# Patient Record
Sex: Female | Born: 1970 | Race: White | Hispanic: No | Marital: Single | Smoking: Current every day smoker
Health system: Southern US, Community
[De-identification: ages and names within clinical notes are randomized; demographics above are authoritative.]

## PROBLEM LIST (undated history)

## (undated) DIAGNOSIS — M5030 Other cervical disc degeneration, unspecified cervical region: Secondary | ICD-10-CM

## (undated) DIAGNOSIS — F419 Anxiety disorder, unspecified: Secondary | ICD-10-CM

## (undated) DIAGNOSIS — F319 Bipolar disorder, unspecified: Secondary | ICD-10-CM

## (undated) DIAGNOSIS — R569 Unspecified convulsions: Secondary | ICD-10-CM

## (undated) DIAGNOSIS — G8929 Other chronic pain: Secondary | ICD-10-CM

## (undated) DIAGNOSIS — F329 Major depressive disorder, single episode, unspecified: Secondary | ICD-10-CM

## (undated) DIAGNOSIS — S0990XA Unspecified injury of head, initial encounter: Secondary | ICD-10-CM

## (undated) DIAGNOSIS — F32A Depression, unspecified: Secondary | ICD-10-CM

## (undated) HISTORY — PX: TUBAL LIGATION: SHX77

## (undated) HISTORY — PX: OTHER SURGICAL HISTORY: SHX169

---

## 2003-02-20 ENCOUNTER — Emergency Department (HOSPITAL_COMMUNITY): Admission: EM | Admit: 2003-02-20 | Discharge: 2003-02-20 | Payer: Self-pay | Admitting: Emergency Medicine

## 2005-02-05 ENCOUNTER — Emergency Department (HOSPITAL_COMMUNITY): Admission: EM | Admit: 2005-02-05 | Discharge: 2005-02-05 | Payer: Self-pay | Admitting: Emergency Medicine

## 2009-10-23 ENCOUNTER — Emergency Department (HOSPITAL_COMMUNITY): Admission: EM | Admit: 2009-10-23 | Discharge: 2009-10-23 | Payer: Self-pay | Admitting: Emergency Medicine

## 2010-07-06 ENCOUNTER — Emergency Department (HOSPITAL_COMMUNITY): Admission: EM | Admit: 2010-07-06 | Discharge: 2010-01-07 | Payer: Self-pay | Admitting: Emergency Medicine

## 2010-09-24 ENCOUNTER — Emergency Department (HOSPITAL_COMMUNITY)
Admission: EM | Admit: 2010-09-24 | Discharge: 2010-09-24 | Disposition: A | Discharge status: Home / Self Care | Payer: Medicaid Other | Attending: Emergency Medicine | Admitting: Emergency Medicine

## 2010-09-24 DIAGNOSIS — R109 Unspecified abdominal pain: Secondary | ICD-10-CM | POA: Insufficient documentation

## 2010-09-24 DIAGNOSIS — R319 Hematuria, unspecified: Secondary | ICD-10-CM | POA: Insufficient documentation

## 2010-09-24 LAB — BASIC METABOLIC PANEL
CO2: 27 mEq/L (ref 19–32)
GFR calc Af Amer: >60 mL/min (ref >60)
GFR calc non Af Amer: 50 mL/min — ABNORMAL LOW (ref >60)
Potassium: 3.5 mEq/L (ref 3.5–5.1)
Sodium: 134 mEq/L — ABNORMAL LOW (ref 135–145)

## 2010-09-24 LAB — URINALYSIS, ROUTINE W REFLEX MICROSCOPIC
Bilirubin Urine: NEGATIVE
Leukocytes, UA: NEGATIVE
Specific Gravity, Urine: 1.02 (ref 1.005–1.030)
Urine Glucose, Fasting: NEGATIVE mg/dL
pH: 6 (ref 5.0–8.0)

## 2010-09-24 LAB — DIFFERENTIAL
Eosinophils Relative: 0 % (ref 0–5)
Monocytes Absolute: 0.5 10*3/uL (ref 0.1–1.0)
Neutro Abs: 6.8 10*3/uL (ref 1.7–7.7)

## 2010-09-24 LAB — CBC
HCT: 47.4 % — ABNORMAL HIGH (ref 36.0–46.0)
Hemoglobin: 16 g/dL — ABNORMAL HIGH (ref 12.0–15.0)
MCH: 31.5 pg (ref 26.0–34.0)
RBC: 5.08 MIL/uL (ref 3.87–5.11)
WBC: 8.5 10*3/uL (ref 4.0–10.5)

## 2010-09-24 LAB — URINE MICROSCOPIC-ADD ON

## 2010-09-26 LAB — URINE CULTURE: Culture  Setup Time: 201202271801

## 2010-10-16 LAB — URINALYSIS, ROUTINE W REFLEX MICROSCOPIC
Hgb urine dipstick: NEGATIVE
Ketones, ur: NEGATIVE mg/dL
Protein, ur: NEGATIVE mg/dL
Specific Gravity, Urine: 1.025 (ref 1.005–1.030)

## 2010-12-15 ENCOUNTER — Emergency Department (HOSPITAL_COMMUNITY): Payer: Medicaid Other

## 2010-12-15 ENCOUNTER — Emergency Department (HOSPITAL_COMMUNITY)
Admission: EM | Admit: 2010-12-15 | Discharge: 2010-12-15 | Disposition: A | Discharge status: Home / Self Care | Payer: Medicaid Other | Attending: Emergency Medicine | Admitting: Emergency Medicine

## 2010-12-15 DIAGNOSIS — S60229A Contusion of unspecified hand, initial encounter: Secondary | ICD-10-CM | POA: Insufficient documentation

## 2010-12-15 DIAGNOSIS — S139XXA Sprain of joints and ligaments of unspecified parts of neck, initial encounter: Secondary | ICD-10-CM | POA: Insufficient documentation

## 2010-12-15 DIAGNOSIS — S6000XA Contusion of unspecified finger without damage to nail, initial encounter: Secondary | ICD-10-CM | POA: Insufficient documentation

## 2011-01-04 ENCOUNTER — Emergency Department (HOSPITAL_COMMUNITY): Payer: Medicaid Other

## 2011-01-04 ENCOUNTER — Emergency Department (HOSPITAL_COMMUNITY)
Admission: EM | Admit: 2011-01-04 | Discharge: 2011-01-04 | Disposition: A | Discharge status: Home / Self Care | Payer: Medicaid Other | Attending: Emergency Medicine | Admitting: Emergency Medicine

## 2011-01-04 DIAGNOSIS — F319 Bipolar disorder, unspecified: Secondary | ICD-10-CM | POA: Insufficient documentation

## 2011-01-04 DIAGNOSIS — F17200 Nicotine dependence, unspecified, uncomplicated: Secondary | ICD-10-CM | POA: Insufficient documentation

## 2011-01-04 DIAGNOSIS — M25569 Pain in unspecified knee: Secondary | ICD-10-CM | POA: Insufficient documentation

## 2011-01-04 DIAGNOSIS — S0003XA Contusion of scalp, initial encounter: Secondary | ICD-10-CM | POA: Insufficient documentation

## 2011-09-29 ENCOUNTER — Encounter (HOSPITAL_COMMUNITY): Payer: Self-pay

## 2011-09-29 ENCOUNTER — Emergency Department (HOSPITAL_COMMUNITY): Payer: Medicaid Other

## 2011-09-29 ENCOUNTER — Emergency Department (HOSPITAL_COMMUNITY)
Admission: EM | Admit: 2011-09-29 | Discharge: 2011-09-29 | Disposition: A | Discharge status: Home / Self Care | Payer: Medicaid Other | Attending: Emergency Medicine | Admitting: Emergency Medicine

## 2011-09-29 DIAGNOSIS — S20219A Contusion of unspecified front wall of thorax, initial encounter: Secondary | ICD-10-CM | POA: Insufficient documentation

## 2011-09-29 DIAGNOSIS — S0990XA Unspecified injury of head, initial encounter: Secondary | ICD-10-CM | POA: Insufficient documentation

## 2011-09-29 DIAGNOSIS — S60222A Contusion of left hand, initial encounter: Secondary | ICD-10-CM

## 2011-09-29 DIAGNOSIS — F319 Bipolar disorder, unspecified: Secondary | ICD-10-CM | POA: Insufficient documentation

## 2011-09-29 DIAGNOSIS — R079 Chest pain, unspecified: Secondary | ICD-10-CM | POA: Insufficient documentation

## 2011-09-29 DIAGNOSIS — M7989 Other specified soft tissue disorders: Secondary | ICD-10-CM | POA: Insufficient documentation

## 2011-09-29 DIAGNOSIS — M79609 Pain in unspecified limb: Secondary | ICD-10-CM | POA: Insufficient documentation

## 2011-09-29 DIAGNOSIS — R51 Headache: Secondary | ICD-10-CM | POA: Insufficient documentation

## 2011-09-29 DIAGNOSIS — S60229A Contusion of unspecified hand, initial encounter: Secondary | ICD-10-CM | POA: Insufficient documentation

## 2011-09-29 DIAGNOSIS — H7290 Unspecified perforation of tympanic membrane, unspecified ear: Secondary | ICD-10-CM | POA: Insufficient documentation

## 2011-09-29 HISTORY — DX: Bipolar disorder, unspecified: F31.9

## 2011-09-29 MED ORDER — HYDROCODONE-ACETAMINOPHEN 5-325 MG PO TABS: ORAL_TABLET | ORAL | Status: DC

## 2011-09-29 MED ORDER — HYDROCODONE-ACETAMINOPHEN 5-325 MG PO TABS
1.0000 | ORAL_TABLET | Freq: Once | ORAL | Status: AC
  Administered 2011-09-29: 1 via ORAL
  Filled 2011-09-29: qty 1

## 2011-09-29 NOTE — Discharge Instructions (Signed)
Blunt Chest Trauma Chest injury caused by blunt trauma can be very painful. The pain is worse with movement or breathing. Most often these injuries result in bruised or fractured ribs. Minor rib fractures may not be seen on the initial X-rays. Most broken and bruised ribs from blunt chest injuries are not serious and get better within 1 to 3 weeks with rest and mild pain medicine. Internal organs (heart, lungs) can be injured with blunt trauma. Further examination may be needed. Limit activities until you can move around without much pain. Do not do any strenuous work until your injury is healed. Ice packs may be put on the injured area for 20 to 30 minutes several times daily to reduce pain. Rib belts may also be used to reduce pain and should be worn as directed by your caregiver. Take several deep breaths hourly to keep your lungs clear.  SEEK IMMEDIATE MEDICAL CARE IF:   You develop increased pain, shortness of breath, or cough up blood.   You develop nausea, vomiting, or abdominal pain.   You develop fever, dizziness, weakness, or fainting.  Document Released: 08/23/2004 Document Revised: 03/28/2011 Document Reviewed: 07/16/2005 [facility] Patient Information 2012 [place], [place].Head Injury, Adult You have had a head injury that does not appear serious at this time. A concussion is a state of changed mental ability, usually from a blow to the head. You should take clear liquids for the rest of the day and then resume your regular diet. You should not take sedatives or alcoholic beverages for as long as directed by your caregiver after discharge. After injuries such as yours, most problems occur within the first 24 hours. SYMPTOMS These minor symptoms may be experienced after discharge:  Memory difficulties.   Dizziness.   Headaches.   Double vision.   Hearing difficulties.   Depression.   Tiredness.   Weakness.   Difficulty with concentration.  If you experience any of these  problems, you should not be alarmed. A concussion requires a few days for recovery. Many patients with head injuries frequently experience such symptoms. Usually, these problems disappear without medical care. If symptoms last for more than one day, notify your caregiver. See your caregiver sooner if symptoms are becoming worse rather than better. HOME CARE INSTRUCTIONS   During the next 24 hours you must stay with someone who can watch you for the warning signs listed below.  Although it is unlikely that serious side effects will occur, you should be aware of signs and symptoms which may necessitate your return to this location. Side effects may occur up to 7 - 10 days following the injury. It is important for you to carefully monitor your condition and contact your caregiver or seek immediate medical attention if there is a change in your condition. SEEK IMMEDIATE MEDICAL CARE IF:   There is confusion or drowsiness.   You can not awaken the injured person.   There is nausea (feeling sick to your stomach) or continued, forceful vomiting.   You notice dizziness or unsteadiness which is getting worse, or inability to walk.   You have convulsions or unconsciousness.   You experience severe, persistent headaches not relieved by over-the-counter or prescription medicines for pain. (Do not take aspirin as this impairs clotting abilities). Take other pain medications only as directed.   You can not use arms or legs normally.   There is clear or bloody discharge from the nose or ears.  MAKE SURE YOU:   Understand these instructions.  Will watch your condition.   Will get help right away if you are not doing well or get worse.  Document Released: 07/16/2005 Document Revised: 03/28/2011 Document Reviewed: 06/03/2009 [facility] Patient Information 2012 [place], [place].Cryotherapy Cryotherapy means treatment with cold. Ice or gel packs can be used to reduce both pain and swelling. Ice is the most  helpful within the first 24 to 48 hours after an injury or flareup from overusing a muscle or joint. Sprains, strains, spasms, burning pain, shooting pain, and aches can all be eased with ice. Ice can also be used when recovering from surgery. Ice is effective, has very few side effects, and is safe for most people to use. PRECAUTIONS  Ice is not a safe treatment option for people with:  Raynaud's phenomenon. This is a condition affecting small blood vessels in the extremities. Exposure to cold may cause your problems to return.   Cold hypersensitivity. There are many forms of cold hypersensitivity, including:   Cold urticaria. Red, itchy hives appear on the skin when the tissues begin to warm after being iced.   Cold erythema. This is a red, itchy rash caused by exposure to cold.   Cold hemoglobinuria. Red blood cells break down when the tissues begin to warm after being iced. The hemoglobin that carry oxygen are passed into the urine because they cannot combine with blood proteins fast enough.   Numbness or altered sensitivity in the area being iced.  If you have any of the following conditions, do not use ice until you have discussed cryotherapy with your caregiver:  Heart conditions, such as arrhythmia, angina, or chronic heart disease.   High blood pressure.   Healing wounds or open skin in the area being iced.   Current infections.   Rheumatoid arthritis.   Poor circulation.   Diabetes.  Ice slows the blood flow in the region it is applied. This is beneficial when trying to stop inflamed tissues from spreading irritating chemicals to surrounding tissues. However, if you expose your skin to cold temperatures for too long or without the proper protection, you can damage your skin or nerves. Watch for signs of skin damage due to cold. HOME CARE INSTRUCTIONS Follow these tips to use ice and cold packs safely.  Place a dry or damp towel between the ice and skin. A damp towel will  cool the skin more quickly, so you may need to shorten the time that the ice is used.   For a more rapid response, add gentle compression to the ice.   Ice for no more than 10 to 20 minutes at a time. The bonier the area you are icing, the less time it will take to get the benefits of ice.   Check your skin after 5 minutes to make sure there are no signs of a poor response to cold or skin damage.   Rest 20 minutes or more in between uses.   Once your skin is numb, you can end your treatment. You can test numbness by very lightly touching your skin. The touch should be so light that you do not see the skin dimple from the pressure of your fingertip. When using ice, most people will feel these normal sensations in this order: cold, burning, aching, and numbness.   Do not use ice on someone who cannot communicate their responses to pain, such as small children or people with dementia.  HOW TO MAKE AN ICE PACK Ice packs are the most common way to use  ice therapy. Other methods include ice massage, ice baths, and cryo-sprays. Muscle creams that cause a cold, tingly feeling do not offer the same benefits that ice offers and should not be used as a substitute unless recommended by your caregiver. To make an ice pack, do one of the following:  Place crushed ice or a bag of frozen vegetables in a sealable plastic bag. Squeeze out the excess air. Place this bag inside another plastic bag. Slide the bag into a pillowcase or place a damp towel between your skin and the bag.   Mix 3 parts water with 1 part rubbing alcohol. Freeze the mixture in a sealable plastic bag. When you remove the mixture from the freezer, it will be slushy. Squeeze out the excess air. Place this bag inside another plastic bag. Slide the bag into a pillowcase or place a damp towel between your skin and the bag.  SEEK MEDICAL CARE IF:  You develop white spots on your skin. This may give the skin a blotchy (mottled) appearance.    Your skin turns blue or pale.   Your skin becomes waxy or hard.   Your swelling gets worse.  MAKE SURE YOU:   Understand these instructions.   Will watch your condition.   Will get help right away if you are not doing well or get worse.  Document Released: 03/12/2011 Document Reviewed: 03/08/2011 [facility] Patient Information 2012 [place], [place].   Take the pain medicine as directed.  Apply ice several times daily to areas of soreness or swelling.  Return to the ED if you note any change in level of consciousness, behavior or coordination.
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
## 2011-09-29 NOTE — ED Notes (Signed)
Pt refusing for RN to provide care per PA. Student nurse at bedside for buddy tape of left index and middle finger and splint placement.
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
## 2011-09-29 NOTE — ED Notes (Signed)
Pt out to nurses station and states "ive been here for 3 1/2 hours. I will go to [facility]". Pt instructed she was the next pt and to return to her room. Pt standing at nurses station yelling. Security notified of pt behavior.

## 2011-09-29 NOTE — ED Notes (Signed)
When pt brought back from x-ray radiotech notified RN of pt behavior in radiology dept. Per radiology tech pt was belligerent, confrontational, and non compliant during radiology exam. Once pt came back while in the hallway pt stated "I need to see one of you right now".

## 2011-09-29 NOTE — ED Notes (Signed)
Pt a/ox4. Resp even and unlabored. NAD at this time. D/C instructions reviewed with pt. Pt verbalized understanding. Pt ambulated to lobby with steady gate.  

## 2011-09-29 NOTE — ED Notes (Signed)
Pt being rude to RN. Pt states "Tiffany Blankenship been here 3 hours. I probably have an aneurism in my head". Pt instructed that behavior will not be tolerated and she will be seen by PA next. Family aplagetic to RN.

## 2011-10-06 NOTE — ED Provider Notes (Signed)
Medical screening examination/treatment/procedure(s) were performed by non-physician practitioner and as supervising physician I was immediately available for consultation/collaboration.  Joseline Mccampbell S. Tiearra Colwell, MD 10/06/11 1814 

## 2012-07-07 ENCOUNTER — Encounter (HOSPITAL_COMMUNITY): Payer: Self-pay | Admitting: *Deleted

## 2012-07-07 ENCOUNTER — Emergency Department (HOSPITAL_COMMUNITY)
Admission: EM | Admit: 2012-07-07 | Discharge: 2012-07-07 | Disposition: A | Discharge status: Home / Self Care | Payer: Medicaid Other | Attending: Emergency Medicine | Admitting: Emergency Medicine

## 2012-07-07 ENCOUNTER — Emergency Department (HOSPITAL_COMMUNITY): Payer: Medicaid Other

## 2012-07-07 DIAGNOSIS — Y9389 Activity, other specified: Secondary | ICD-10-CM | POA: Insufficient documentation

## 2012-07-07 DIAGNOSIS — Z79899 Other long term (current) drug therapy: Secondary | ICD-10-CM | POA: Insufficient documentation

## 2012-07-07 DIAGNOSIS — S022XXA Fracture of nasal bones, initial encounter for closed fracture: Secondary | ICD-10-CM

## 2012-07-07 DIAGNOSIS — F319 Bipolar disorder, unspecified: Secondary | ICD-10-CM | POA: Insufficient documentation

## 2012-07-07 MED ORDER — CEPHALEXIN 500 MG PO CAPS: 500.0000 mg | ORAL_CAPSULE | Freq: Four times a day (QID) | ORAL | Status: DC

## 2012-07-07 MED ORDER — HYDROCODONE-ACETAMINOPHEN 5-325 MG PO TABS
1.0000 | ORAL_TABLET | Freq: Once | ORAL | Status: AC
  Administered 2012-07-07: 1 via ORAL
  Filled 2012-07-07: qty 1

## 2012-07-07 MED ORDER — HYDROCODONE-ACETAMINOPHEN 5-325 MG PO TABS: ORAL_TABLET | ORAL | Status: DC
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
## 2012-07-11 NOTE — ED Provider Notes (Signed)
Medical screening examination/treatment/procedure(s) were conducted as a shared visit with non-physician practitioner(s) and myself.  I personally evaluated the patient during the encounter.  No neuro def.  CT shows bilat nasal bone fx  Donnetta Hutching, MD 07/11/12 [id number]

## 2012-10-13 NOTE — ED Notes (Signed)
rx tech from [facility] in [place] called pt attempting to fill script from dec 9th, 2013, wanted information and requested if should fill, dr.cook notified. Told not to fill script at this time. [facility] rx tech notified.

## 2012-12-17 ENCOUNTER — Emergency Department (HOSPITAL_COMMUNITY): Payer: Medicaid Other

## 2012-12-17 ENCOUNTER — Encounter (HOSPITAL_COMMUNITY): Payer: Self-pay

## 2012-12-17 ENCOUNTER — Emergency Department (HOSPITAL_COMMUNITY)
Admission: EM | Admit: 2012-12-17 | Discharge: 2012-12-17 | Disposition: A | Discharge status: Home / Self Care | Payer: Medicaid Other | Attending: Emergency Medicine | Admitting: Emergency Medicine

## 2012-12-17 DIAGNOSIS — F17200 Nicotine dependence, unspecified, uncomplicated: Secondary | ICD-10-CM | POA: Insufficient documentation

## 2012-12-17 DIAGNOSIS — R110 Nausea: Secondary | ICD-10-CM | POA: Insufficient documentation

## 2012-12-17 DIAGNOSIS — S060X9A Concussion with loss of consciousness of unspecified duration, initial encounter: Secondary | ICD-10-CM | POA: Insufficient documentation

## 2012-12-17 DIAGNOSIS — Y9289 Other specified places as the place of occurrence of the external cause: Secondary | ICD-10-CM | POA: Insufficient documentation

## 2012-12-17 DIAGNOSIS — S025XXA Fracture of tooth (traumatic), initial encounter for closed fracture: Secondary | ICD-10-CM | POA: Insufficient documentation

## 2012-12-17 DIAGNOSIS — S0993XA Unspecified injury of face, initial encounter: Secondary | ICD-10-CM | POA: Insufficient documentation

## 2012-12-17 DIAGNOSIS — Z880 Allergy status to penicillin: Secondary | ICD-10-CM | POA: Insufficient documentation

## 2012-12-17 DIAGNOSIS — F319 Bipolar disorder, unspecified: Secondary | ICD-10-CM | POA: Insufficient documentation

## 2012-12-17 DIAGNOSIS — S9030XA Contusion of unspecified foot, initial encounter: Secondary | ICD-10-CM | POA: Insufficient documentation

## 2012-12-17 DIAGNOSIS — Z79899 Other long term (current) drug therapy: Secondary | ICD-10-CM | POA: Insufficient documentation

## 2012-12-17 DIAGNOSIS — W1809XA Striking against other object with subsequent fall, initial encounter: Secondary | ICD-10-CM | POA: Insufficient documentation

## 2012-12-17 DIAGNOSIS — S069X9A Unspecified intracranial injury with loss of consciousness of unspecified duration, initial encounter: Secondary | ICD-10-CM

## 2012-12-17 DIAGNOSIS — Y9301 Activity, walking, marching and hiking: Secondary | ICD-10-CM | POA: Insufficient documentation

## 2012-12-17 DIAGNOSIS — Z3202 Encounter for pregnancy test, result negative: Secondary | ICD-10-CM | POA: Insufficient documentation

## 2012-12-17 DIAGNOSIS — S298XXA Other specified injuries of thorax, initial encounter: Secondary | ICD-10-CM | POA: Insufficient documentation

## 2012-12-17 DIAGNOSIS — T07XXXA Unspecified multiple injuries, initial encounter: Secondary | ICD-10-CM

## 2012-12-17 HISTORY — DX: Unspecified injury of head, initial encounter: S09.90XA

## 2012-12-17 MED ORDER — MELOXICAM 7.5 MG PO TABS
7.5000 mg | ORAL_TABLET | Freq: Once | ORAL | Status: AC
  Administered 2012-12-17: 7.5 mg via ORAL
  Filled 2012-12-17: qty 1

## 2012-12-17 MED ORDER — ONDANSETRON HCL 4 MG PO TABS: 4.0000 mg | ORAL_TABLET | Freq: Three times a day (TID) | ORAL | Status: DC | PRN

## 2012-12-17 MED ORDER — MELOXICAM 7.5 MG PO TABS: 7.5000 mg | ORAL_TABLET | Freq: Every day | ORAL | Status: DC

## 2012-12-17 MED ORDER — IBUPROFEN 600 MG PO TABS: 600.0000 mg | ORAL_TABLET | Freq: Four times a day (QID) | ORAL | Status: DC | PRN

## 2012-12-17 MED ORDER — HYDROCODONE-ACETAMINOPHEN 5-325 MG PO TABS
1.0000 | ORAL_TABLET | Freq: Once | ORAL | Status: DC
  Filled 2012-12-17: qty 1

## 2012-12-17 MED ORDER — ONDANSETRON 8 MG PO TBDP
8.0000 mg | ORAL_TABLET | Freq: Once | ORAL | Status: AC
  Administered 2012-12-17: 8 mg via ORAL
  Filled 2012-12-17: qty 1

## 2012-12-17 MED ORDER — HYDROCODONE-ACETAMINOPHEN 5-325 MG PO TABS: 1.0000 | ORAL_TABLET | ORAL | Status: DC | PRN
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
## 2012-12-17 NOTE — ED Notes (Signed)
Pt reports was walking up her stairs outside and tripped over dog chain.  .  C/O pain to head, neck, chest, chipped front tooth.  Pt says unknown if lost consciousness.

## 2013-05-15 ENCOUNTER — Encounter (HOSPITAL_COMMUNITY): Payer: Self-pay | Admitting: Emergency Medicine

## 2013-05-15 ENCOUNTER — Emergency Department (HOSPITAL_COMMUNITY): Payer: Medicaid Other

## 2013-05-15 ENCOUNTER — Emergency Department (HOSPITAL_COMMUNITY)
Admission: EM | Admit: 2013-05-15 | Discharge: 2013-05-15 | Disposition: A | Discharge status: Home / Self Care | Payer: Medicaid Other | Attending: Emergency Medicine | Admitting: Emergency Medicine

## 2013-05-15 DIAGNOSIS — Z8739 Personal history of other diseases of the musculoskeletal system and connective tissue: Secondary | ICD-10-CM | POA: Insufficient documentation

## 2013-05-15 DIAGNOSIS — W1809XA Striking against other object with subsequent fall, initial encounter: Secondary | ICD-10-CM | POA: Insufficient documentation

## 2013-05-15 DIAGNOSIS — J069 Acute upper respiratory infection, unspecified: Secondary | ICD-10-CM | POA: Insufficient documentation

## 2013-05-15 DIAGNOSIS — S0003XA Contusion of scalp, initial encounter: Secondary | ICD-10-CM | POA: Insufficient documentation

## 2013-05-15 DIAGNOSIS — IMO0002 Reserved for concepts with insufficient information to code with codable children: Secondary | ICD-10-CM | POA: Insufficient documentation

## 2013-05-15 DIAGNOSIS — F17200 Nicotine dependence, unspecified, uncomplicated: Secondary | ICD-10-CM | POA: Insufficient documentation

## 2013-05-15 DIAGNOSIS — Z880 Allergy status to penicillin: Secondary | ICD-10-CM | POA: Insufficient documentation

## 2013-05-15 DIAGNOSIS — Z791 Long term (current) use of non-steroidal anti-inflammatories (NSAID): Secondary | ICD-10-CM | POA: Insufficient documentation

## 2013-05-15 DIAGNOSIS — W19XXXA Unspecified fall, initial encounter: Secondary | ICD-10-CM

## 2013-05-15 DIAGNOSIS — M79675 Pain in left toe(s): Secondary | ICD-10-CM

## 2013-05-15 DIAGNOSIS — M79644 Pain in right finger(s): Secondary | ICD-10-CM

## 2013-05-15 DIAGNOSIS — Z79899 Other long term (current) drug therapy: Secondary | ICD-10-CM | POA: Insufficient documentation

## 2013-05-15 DIAGNOSIS — Y92009 Unspecified place in unspecified non-institutional (private) residence as the place of occurrence of the external cause: Secondary | ICD-10-CM | POA: Insufficient documentation

## 2013-05-15 DIAGNOSIS — S6980XA Other specified injuries of unspecified wrist, hand and finger(s), initial encounter: Secondary | ICD-10-CM | POA: Insufficient documentation

## 2013-05-15 DIAGNOSIS — Y9389 Activity, other specified: Secondary | ICD-10-CM | POA: Insufficient documentation

## 2013-05-15 DIAGNOSIS — G8929 Other chronic pain: Secondary | ICD-10-CM | POA: Insufficient documentation

## 2013-05-15 DIAGNOSIS — T07XXXA Unspecified multiple injuries, initial encounter: Secondary | ICD-10-CM

## 2013-05-15 DIAGNOSIS — Z87828 Personal history of other (healed) physical injury and trauma: Secondary | ICD-10-CM | POA: Insufficient documentation

## 2013-05-15 DIAGNOSIS — S6990XA Unspecified injury of unspecified wrist, hand and finger(s), initial encounter: Secondary | ICD-10-CM | POA: Insufficient documentation

## 2013-05-15 DIAGNOSIS — S90129A Contusion of unspecified lesser toe(s) without damage to nail, initial encounter: Secondary | ICD-10-CM | POA: Insufficient documentation

## 2013-05-15 DIAGNOSIS — F319 Bipolar disorder, unspecified: Secondary | ICD-10-CM | POA: Insufficient documentation

## 2013-05-15 HISTORY — DX: Other cervical disc degeneration, unspecified cervical region: M50.30

## 2013-05-15 HISTORY — DX: Other chronic pain: G89.29

## 2013-05-15 MED ORDER — METHOCARBAMOL 500 MG PO TABS: 1000.0000 mg | ORAL_TABLET | Freq: Four times a day (QID) | ORAL | Status: DC | PRN

## 2013-05-15 MED ORDER — NAPROXEN 250 MG PO TABS: 250.0000 mg | ORAL_TABLET | Freq: Two times a day (BID) | ORAL | Status: DC

## 2013-05-15 MED ORDER — OXYCODONE-ACETAMINOPHEN 5-325 MG PO TABS
1.0000 | ORAL_TABLET | Freq: Once | ORAL | Status: AC
  Administered 2013-05-15: 1 via ORAL
  Filled 2013-05-15: qty 1

## 2013-05-15 MED ORDER — BENZONATATE 100 MG PO CAPS: 100.0000 mg | ORAL_CAPSULE | Freq: Three times a day (TID) | ORAL | Status: DC | PRN

## 2013-05-15 NOTE — ED Notes (Signed)
During discharge process patient expressed dislike for physician, states nothing was done for her. Offered to clean the wound on back of had and patient refused to have wounds cleaned and states she is going to  [facility]. Walked out continuing to complain.

## 2013-05-15 NOTE — ED Notes (Signed)
Patient is asleep.  

## 2013-05-15 NOTE — ED Notes (Signed)
Fell last night, Struck back of head,  Scabbed area to lt knee,  Pain, swelling lt foot., rt thumb hurts.  ?LOC,  Ems came and pt refused transport last night,  Pt says she tripped over her dog.  Cough
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
## 2013-05-15 NOTE — ED Notes (Signed)
C/o headache/head injury s/p fall last night; states her dog's leash got tangled around her legs causing her to fall backward and hit the back of her head.  Small laceration noted, bleeding controlled.  Reports +LOC of approx 2-3 minutes; also c/o cough.

## 2014-01-22 ENCOUNTER — Encounter (HOSPITAL_COMMUNITY): Payer: Self-pay | Admitting: Emergency Medicine

## 2014-01-22 ENCOUNTER — Emergency Department (HOSPITAL_COMMUNITY): Payer: Medicaid Other

## 2014-01-22 ENCOUNTER — Emergency Department (HOSPITAL_COMMUNITY)
Admission: EM | Admit: 2014-01-22 | Discharge: 2014-01-22 | Disposition: A | Discharge status: Home / Self Care | Payer: Medicaid Other | Attending: Emergency Medicine | Admitting: Emergency Medicine

## 2014-01-22 DIAGNOSIS — F17200 Nicotine dependence, unspecified, uncomplicated: Secondary | ICD-10-CM | POA: Diagnosis not present

## 2014-01-22 DIAGNOSIS — G8929 Other chronic pain: Secondary | ICD-10-CM | POA: Diagnosis not present

## 2014-01-22 DIAGNOSIS — F319 Bipolar disorder, unspecified: Secondary | ICD-10-CM | POA: Insufficient documentation

## 2014-01-22 DIAGNOSIS — Z87828 Personal history of other (healed) physical injury and trauma: Secondary | ICD-10-CM | POA: Diagnosis not present

## 2014-01-22 DIAGNOSIS — S8000XA Contusion of unspecified knee, initial encounter: Secondary | ICD-10-CM | POA: Diagnosis not present

## 2014-01-22 DIAGNOSIS — Y92410 Unspecified street and highway as the place of occurrence of the external cause: Secondary | ICD-10-CM | POA: Insufficient documentation

## 2014-01-22 DIAGNOSIS — Y9301 Activity, walking, marching and hiking: Secondary | ICD-10-CM | POA: Diagnosis not present

## 2014-01-22 DIAGNOSIS — IMO0002 Reserved for concepts with insufficient information to code with codable children: Secondary | ICD-10-CM | POA: Insufficient documentation

## 2014-01-22 DIAGNOSIS — Z880 Allergy status to penicillin: Secondary | ICD-10-CM | POA: Diagnosis not present

## 2014-01-22 DIAGNOSIS — S8990XA Unspecified injury of unspecified lower leg, initial encounter: Secondary | ICD-10-CM | POA: Diagnosis present

## 2014-01-22 DIAGNOSIS — Z79899 Other long term (current) drug therapy: Secondary | ICD-10-CM | POA: Insufficient documentation

## 2014-01-22 DIAGNOSIS — J40 Bronchitis, not specified as acute or chronic: Secondary | ICD-10-CM | POA: Diagnosis not present

## 2014-01-22 DIAGNOSIS — Z8739 Personal history of other diseases of the musculoskeletal system and connective tissue: Secondary | ICD-10-CM | POA: Diagnosis not present

## 2014-01-22 DIAGNOSIS — S8002XA Contusion of left knee, initial encounter: Secondary | ICD-10-CM

## 2014-01-22 DIAGNOSIS — S99919A Unspecified injury of unspecified ankle, initial encounter: Secondary | ICD-10-CM | POA: Diagnosis present

## 2014-01-22 MED ORDER — DOXYCYCLINE HYCLATE 100 MG PO CAPS: 100.0000 mg | ORAL_CAPSULE | Freq: Two times a day (BID) | ORAL | Status: DC

## 2014-01-22 MED ORDER — OXYCODONE-ACETAMINOPHEN 5-325 MG PO TABS
1.0000 | ORAL_TABLET | Freq: Once | ORAL | Status: AC
  Administered 2014-01-22: 1 via ORAL
  Filled 2014-01-22: qty 1

## 2014-01-22 MED ORDER — OXYCODONE-ACETAMINOPHEN 5-325 MG PO TABS: 1.0000 | ORAL_TABLET | ORAL | Status: DC | PRN

## 2014-01-22 MED ORDER — ONDANSETRON 4 MG PO TBDP
8.0000 mg | ORAL_TABLET | Freq: Once | ORAL | Status: AC
  Administered 2014-01-22: 8 mg via ORAL
  Filled 2014-01-22: qty 2

## 2014-01-22 MED ORDER — HYDROMORPHONE HCL PF 1 MG/ML IJ SOLN
2.0000 mg | Freq: Once | INTRAMUSCULAR | Status: AC
  Administered 2014-01-22: 2 mg via INTRAMUSCULAR
  Filled 2014-01-22: qty 2

## 2014-01-22 NOTE — Progress Notes (Signed)
Orthopedic Tech Progress Note Patient Details:  Tiffany Blankenship 1970-08-31 [id number]  Ortho Devices Type of Ortho Device: Ace wrap;Watson Jones splint Ortho Device/Splint Location: lle Ortho Device/Splint Interventions: Application   Tiffany Blankenship 01/22/2014, 9:57 PM

## 2014-01-22 NOTE — ED Notes (Signed)
Ortho paged. 

## 2014-01-22 NOTE — ED Notes (Signed)
Pt not wanting to keep knee immobilizer on, family requesting immobilizer to be removed. Wanting to leave AMA. Dr. Effie ShyWentz aware and at bedside and will print discharge paperwork. Pt convinced to keep knee immobilizer on.
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
## 2014-01-22 NOTE — Discharge Instructions (Signed)
Contusion A contusion is a deep bruise. Contusions are the result of an injury that caused bleeding under the skin. The contusion may turn blue, purple, or yellow. Minor injuries will give you a painless contusion, but more severe contusions may stay painful and swollen for a few weeks.  CAUSES  A contusion is usually caused by a blow, trauma, or direct force to an area of the body. SYMPTOMS   Swelling and redness of the injured area.  Bruising of the injured area.  Tenderness and soreness of the injured area.  Pain. DIAGNOSIS  The diagnosis can be made by taking a history and physical exam. An X-ray, CT scan, or MRI may be needed to determine if there were any associated injuries, such as fractures. TREATMENT  Specific treatment will depend on what area of the body was injured. In general, the best treatment for a contusion is resting, icing, elevating, and applying cold compresses to the injured area. Over-the-counter medicines may also be recommended for pain control. Ask your caregiver what the best treatment is for your contusion. HOME CARE INSTRUCTIONS   Put ice on the injured area.  Put ice in a plastic bag.  Place a towel between your skin and the bag.  Leave the ice on for 15-20 minutes, 3-4 times a day, or as directed by your health care provider.  Only take over-the-counter or prescription medicines for pain, discomfort, or fever as directed by your caregiver. Your caregiver may recommend avoiding anti-inflammatory medicines (aspirin, ibuprofen, and naproxen) for 48 hours because these medicines may increase bruising.  Rest the injured area.  If possible, elevate the injured area to reduce swelling. SEEK IMMEDIATE MEDICAL CARE IF:   You have increased bruising or swelling.  You have pain that is getting worse.  Your swelling or pain is not relieved with medicines. MAKE SURE YOU:   Understand these instructions.  Will watch your condition.  Will get help right  away if you are not doing well or get worse. Document Released: 04/25/2005 Document Revised: 07/21/2013 Document Reviewed: 05/21/2011 [facility] Patient Information 2015 [place], [place]. This information is not intended to replace advice given to you by your health care provider. Make sure you discuss any questions you have with your health care provider.  Bronchitis Bronchitis is swelling (inflammation) of the air tubes leading to your lungs (bronchi). This causes mucus and a cough. If the swelling gets bad, you may have trouble breathing. HOME CARE   Rest.  Drink enough fluids to keep your pee (urine) clear or pale yellow (unless you have a condition where you have to watch how much you drink).  Only take medicine as told by your doctor. If you were given antibiotic medicines, finish them even if you start to feel better.  Avoid smoke, irritating chemicals, and strong smells. These make the problem worse. Quit smoking if you smoke. This helps your lungs heal faster.  Use a cool mist humidifier. Change the water in the humidifier every day. You can also sit in the bathroom with hot shower running for 5-10 minutes. Keep the door closed.  See your health care provider as told.  Wash your hands often. GET HELP IF: Your problems do not get better after 1 week. GET HELP RIGHT AWAY IF:   Your fever gets worse.  You have chills.  Your chest hurts.  Your problems breathing get worse.  You have blood in your mucus.  You pass out (faint).  You feel light-headed.  You have  a bad headache.  You throw up (vomit) again and again. MAKE SURE YOU:  Understand these instructions.  Will watch your condition.  Will get help right away if you are not doing well or get worse. Document Released: 01/02/2008 Document Revised: 07/21/2013 Document Reviewed: 03/10/2013 [facility] Patient Information 2015 [place], [place]. This information is not intended to replace advice given to you by your  health care provider. Make sure you discuss any questions you have with your health care provider.
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
## 2014-01-22 NOTE — ED Notes (Signed)
Pt A&Ox4, wheeled out of ED via wheelchair, NAD 

## 2014-02-07 ENCOUNTER — Emergency Department (HOSPITAL_COMMUNITY)
Admission: EM | Admit: 2014-02-07 | Discharge: 2014-02-07 | Disposition: A | Discharge status: Home / Self Care | Payer: Medicaid Other | Attending: Emergency Medicine | Admitting: Emergency Medicine

## 2014-02-07 ENCOUNTER — Emergency Department (HOSPITAL_COMMUNITY): Payer: Medicaid Other

## 2014-02-07 ENCOUNTER — Encounter (HOSPITAL_COMMUNITY): Payer: Self-pay | Admitting: Emergency Medicine

## 2014-02-07 DIAGNOSIS — G8929 Other chronic pain: Secondary | ICD-10-CM | POA: Insufficient documentation

## 2014-02-07 DIAGNOSIS — F319 Bipolar disorder, unspecified: Secondary | ICD-10-CM | POA: Insufficient documentation

## 2014-02-07 DIAGNOSIS — F411 Generalized anxiety disorder: Secondary | ICD-10-CM | POA: Diagnosis not present

## 2014-02-07 DIAGNOSIS — Z8739 Personal history of other diseases of the musculoskeletal system and connective tissue: Secondary | ICD-10-CM | POA: Diagnosis not present

## 2014-02-07 DIAGNOSIS — R221 Localized swelling, mass and lump, neck: Secondary | ICD-10-CM | POA: Diagnosis present

## 2014-02-07 DIAGNOSIS — Z79899 Other long term (current) drug therapy: Secondary | ICD-10-CM | POA: Diagnosis not present

## 2014-02-07 DIAGNOSIS — Z765 Malingerer [conscious simulation]: Secondary | ICD-10-CM | POA: Insufficient documentation

## 2014-02-07 DIAGNOSIS — Z792 Long term (current) use of antibiotics: Secondary | ICD-10-CM | POA: Diagnosis not present

## 2014-02-07 DIAGNOSIS — F1910 Other psychoactive substance abuse, uncomplicated: Secondary | ICD-10-CM | POA: Insufficient documentation

## 2014-02-07 DIAGNOSIS — F17200 Nicotine dependence, unspecified, uncomplicated: Secondary | ICD-10-CM | POA: Insufficient documentation

## 2014-02-07 DIAGNOSIS — T792XXA Traumatic secondary and recurrent hemorrhage and seroma, initial encounter: Secondary | ICD-10-CM | POA: Insufficient documentation

## 2014-02-07 DIAGNOSIS — Z880 Allergy status to penicillin: Secondary | ICD-10-CM | POA: Diagnosis not present

## 2014-02-07 DIAGNOSIS — R220 Localized swelling, mass and lump, head: Secondary | ICD-10-CM | POA: Diagnosis present

## 2014-02-07 HISTORY — DX: Major depressive disorder, single episode, unspecified: F32.9

## 2014-02-07 HISTORY — DX: Depression, unspecified: F32.A

## 2014-02-07 HISTORY — DX: Anxiety disorder, unspecified: F41.9

## 2014-02-07 HISTORY — DX: Unspecified convulsions: R56.9

## 2014-02-07 MED ORDER — NAPROXEN SODIUM 550 MG PO TABS: 550.0000 mg | ORAL_TABLET | Freq: Two times a day (BID) | ORAL | Status: AC

## 2014-02-07 MED ORDER — FENTANYL CITRATE 0.05 MG/ML IJ SOLN
50.0000 ug | Freq: Once | INTRAMUSCULAR | Status: AC
  Administered 2014-02-07: 50 ug via INTRAMUSCULAR
  Filled 2014-02-07: qty 2

## 2014-02-07 MED ORDER — KETOROLAC TROMETHAMINE 30 MG/ML IJ SOLN
60.0000 mg | Freq: Once | INTRAMUSCULAR | Status: AC
  Administered 2014-02-07: 60 mg via INTRAVENOUS
  Filled 2014-02-07: qty 2
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
## 2014-02-07 NOTE — Discharge Instructions (Signed)
Elevate your leg. Use ice and heat to the area. You can use an ace wrap for comfort. Take the anaprox for pain. You will need to see Dr Tyler DeisWheeler to get more of your chronic pain medications. You can have an orthopedist recheck your leg, you can call someone of your choice or Dr Hilda LiasKeeling, the orthopedist on call.    Chronic Pain Discharge Instructions  Emergency care providers appreciate that many patients coming to us are in severe pain and we wish to address their pain in the safest, most responsible manner.  It is important to recognize however, that the proper treatment of chronic pain differs from that of the pain of injuries and acute illnesses.  Our goal is to provide quality, safe, personalized care and we thank you for giving us the opportunity to serve you. The use of narcotics and related agents for chronic pain syndromes may lead to additional physical and psychological problems.  Nearly as many people die from prescription narcotics each year as die from car crashes.  Additionally, this risk is increased if such prescriptions are obtained from a variety of sources.  Therefore, only your primary care physician or a pain management specialist is able to safely treat such syndromes with narcotic medications long-term.    Documentation revealing such prescriptions have been sought from multiple sources may prohibit us from providing a refill or different narcotic medication.  Your name may be checked first through the [facility] Controlled Substances Reporting System.  This database is a record of controlled substance medication prescriptions that the patient has received.  This has been established by [facility] in an effort to eliminate the dangerous, and often life threatening, practice of obtaining multiple prescriptions from different medical providers.   If you have a chronic pain syndrome (i.e. chronic headaches, recurrent back or neck pain, dental pain, abdominal or pelvis pain without  a specific diagnosis, or neuropathic pain such as fibromyalgia) or recurrent visits for the same condition without an acute diagnosis, you may be treated with non-narcotics and other non-addictive medicines.  Allergic reactions or negative side effects that may be reported by a patient to such medications will not typically lead to the use of a narcotic analgesic or other controlled substance as an alternative.   Patients managing chronic pain with a personal physician should have provisions in place for breakthrough pain.  If you are in crisis, you should call your physician.  If your physician directs you to the emergency department, please have the doctor call and speak to our attending physician concerning your care.   When patients come to the Emergency Department (ED) with acute medical conditions in which the Emergency Department physician feels appropriate to prescribe narcotic or sedating pain medication, the physician will prescribe these in very limited quantities.  The amount of these medications will last only until you can see your primary care physician in his/her office.  Any patient who returns to the ED seeking refills should expect only non-narcotic pain medications.   In the event of an acute medical condition exists and the emergency physician feels it is necessary that the patient be given a narcotic or sedating medication -  a responsible adult driver should be present in the room prior to the medication being given by the nurse.   Prescriptions for narcotic or sedating medications that have been lost, stolen or expired will not be refilled in the Emergency Department.    Patients who have chronic pain may receive non-narcotic  prescriptions until seen by their primary care physician.  It is every patients personal responsibility to maintain active prescriptions with his or her primary care physician or specialist.
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
## 2014-05-31 ENCOUNTER — Encounter (HOSPITAL_COMMUNITY): Payer: Self-pay | Admitting: Emergency Medicine

## 2014-07-09 ENCOUNTER — Emergency Department (HOSPITAL_COMMUNITY)
Admission: EM | Admit: 2014-07-09 | Discharge: 2014-07-10 | Disposition: A | Discharge status: Home / Self Care | Payer: Medicaid Other | Attending: Emergency Medicine | Admitting: Emergency Medicine

## 2014-07-09 ENCOUNTER — Encounter (HOSPITAL_COMMUNITY): Payer: Self-pay | Admitting: Emergency Medicine

## 2014-07-09 ENCOUNTER — Emergency Department (HOSPITAL_COMMUNITY): Payer: Medicaid Other

## 2014-07-09 DIAGNOSIS — F419 Anxiety disorder, unspecified: Secondary | ICD-10-CM | POA: Insufficient documentation

## 2014-07-09 DIAGNOSIS — M25462 Effusion, left knee: Secondary | ICD-10-CM | POA: Diagnosis not present

## 2014-07-09 DIAGNOSIS — G8929 Other chronic pain: Secondary | ICD-10-CM | POA: Insufficient documentation

## 2014-07-09 DIAGNOSIS — Z79899 Other long term (current) drug therapy: Secondary | ICD-10-CM | POA: Insufficient documentation

## 2014-07-09 DIAGNOSIS — Z720 Tobacco use: Secondary | ICD-10-CM | POA: Insufficient documentation

## 2014-07-09 DIAGNOSIS — Z880 Allergy status to penicillin: Secondary | ICD-10-CM | POA: Insufficient documentation

## 2014-07-09 DIAGNOSIS — M2540 Effusion, unspecified joint: Secondary | ICD-10-CM

## 2014-07-09 DIAGNOSIS — F329 Major depressive disorder, single episode, unspecified: Secondary | ICD-10-CM | POA: Diagnosis not present

## 2014-07-09 DIAGNOSIS — R609 Edema, unspecified: Secondary | ICD-10-CM

## 2014-07-09 DIAGNOSIS — M25562 Pain in left knee: Secondary | ICD-10-CM | POA: Diagnosis present

## 2014-07-09 DIAGNOSIS — R52 Pain, unspecified: Secondary | ICD-10-CM

## 2014-07-09 LAB — BASIC METABOLIC PANEL
Anion gap: 13 (ref 5–15)
BUN: 14 mg/dL (ref 6–23)
CO2: 27 meq/L (ref 19–32)
Calcium: 9.1 mg/dL (ref 8.4–10.5)
Chloride: 101 mEq/L (ref 96–112)
Creatinine, Ser: 1.16 mg/dL — ABNORMAL HIGH (ref 0.50–1.10)
GFR calc Af Amer: 65 mL/min — ABNORMAL LOW (ref >90)
GFR calc non Af Amer: 56 mL/min — ABNORMAL LOW (ref >90)
GLUCOSE: 77 mg/dL (ref 70–99)
POTASSIUM: 4.2 meq/L (ref 3.7–5.3)
SODIUM: 141 meq/L (ref 137–147)

## 2014-07-09 LAB — CBC WITH DIFFERENTIAL/PLATELET
Basophils Absolute: 0 10*3/uL (ref 0.0–0.1)
Basophils Relative: 0 % (ref 0–1)
EOS PCT: 1 % (ref 0–5)
Eosinophils Absolute: 0.1 10*3/uL (ref 0.0–0.7)
HCT: 46.9 % — ABNORMAL HIGH (ref 36.0–46.0)
HEMOGLOBIN: 16.5 g/dL — AB (ref 12.0–15.0)
LYMPHS ABS: 3.2 10*3/uL (ref 0.7–4.0)
Lymphocytes Relative: 31 % (ref 12–46)
MCH: 32.5 pg (ref 26.0–34.0)
MCHC: 35.2 g/dL (ref 30.0–36.0)
MCV: 92.3 fL (ref 78.0–100.0)
Monocytes Absolute: 0.4 10*3/uL (ref 0.1–1.0)
Monocytes Relative: 4 % (ref 3–12)
NEUTROS PCT: 64 % (ref 43–77)
Neutro Abs: 6.6 10*3/uL (ref 1.7–7.7)
PLATELETS: 272 10*3/uL (ref 150–400)
RBC: 5.08 MIL/uL (ref 3.87–5.11)
RDW: 12.6 % (ref 11.5–15.5)
WBC: 10.3 10*3/uL (ref 4.0–10.5)

## 2014-07-09 MED ORDER — ONDANSETRON HCL 4 MG/2ML IJ SOLN
4.0000 mg | Freq: Once | INTRAMUSCULAR | Status: AC
  Administered 2014-07-09: 4 mg via INTRAVENOUS
  Filled 2014-07-09: qty 2

## 2014-07-09 MED ORDER — MORPHINE SULFATE 4 MG/ML IJ SOLN
4.0000 mg | Freq: Once | INTRAMUSCULAR | Status: AC
  Administered 2014-07-09: 4 mg via INTRAVENOUS
  Filled 2014-07-09: qty 1

## 2014-07-09 MED ORDER — SODIUM CHLORIDE 0.9 % IV BOLUS (SEPSIS)
1000.0000 mL | Freq: Once | INTRAVENOUS | Status: AC
  Administered 2014-07-09: 1000 mL via INTRAVENOUS
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
## 2014-07-09 NOTE — ED Notes (Signed)
Pt. reports progressing (chronic) left knee pain / swelling for the past several days , denies recent injury /ambulatory , no fever or chills.

## 2014-07-10 LAB — SYNOVIAL CELL COUNT + DIFF, W/ CRYSTALS
Crystals, Fluid: NONE SEEN
EOSINOPHILS-SYNOVIAL: 1 % (ref 0–1)
Lymphocytes-Synovial Fld: 17 % (ref 0–20)
MONOCYTE-MACROPHAGE-SYNOVIAL FLUID: 74 % (ref 50–90)
NEUTROPHIL, SYNOVIAL: 8 % (ref 0–25)
WBC, SYNOVIAL: 37 /mm3 (ref 0–200)

## 2014-07-10 LAB — GRAM STAIN: Special Requests: NORMAL

## 2014-07-10 MED ORDER — ALPRAZOLAM 1 MG PO TABS: 1.0000 mg | ORAL_TABLET | Freq: Every evening | ORAL | Status: AC | PRN

## 2014-07-10 MED ORDER — LIDOCAINE-EPINEPHRINE (PF) 2 %-1:200000 IJ SOLN
20.0000 mL | Freq: Once | INTRAMUSCULAR | Status: AC
  Administered 2014-07-10: 10 mL
  Filled 2014-07-10: qty 20

## 2014-07-10 MED ORDER — HYDROMORPHONE HCL 1 MG/ML IJ SOLN
0.5000 mg | Freq: Once | INTRAMUSCULAR | Status: AC
  Administered 2014-07-10: 0.5 mg via INTRAVENOUS
  Filled 2014-07-10: qty 1

## 2014-07-10 MED ORDER — OXYCODONE-ACETAMINOPHEN 5-325 MG PO TABS: 1.0000 | ORAL_TABLET | Freq: Four times a day (QID) | ORAL | Status: AC | PRN

## 2014-07-10 MED ORDER — ONDANSETRON 4 MG PO TBDP
4.0000 mg | ORAL_TABLET | Freq: Once | ORAL | Status: AC
  Administered 2014-07-10: 4 mg via ORAL
  Filled 2014-07-10: qty 1

## 2014-07-10 NOTE — Discharge Instructions (Signed)

## 2014-07-13 LAB — BODY FLUID CULTURE: Culture: NO GROWTH

## 2014-07-16 LAB — CULTURE, BLOOD (ROUTINE X 2)
CULTURE: NO GROWTH
Culture: NO GROWTH

## 2015-06-21 IMAGING — CR DG ELBOW COMPLETE 3+V*L*
4 series · 4 of 4 positions shown · non-contrast
Comparison: None.

CLINICAL DATA: Fall

EXAM:
LEFT ELBOW - COMPLETE 3+ VIEW

[view not recorded (1 of 4)]
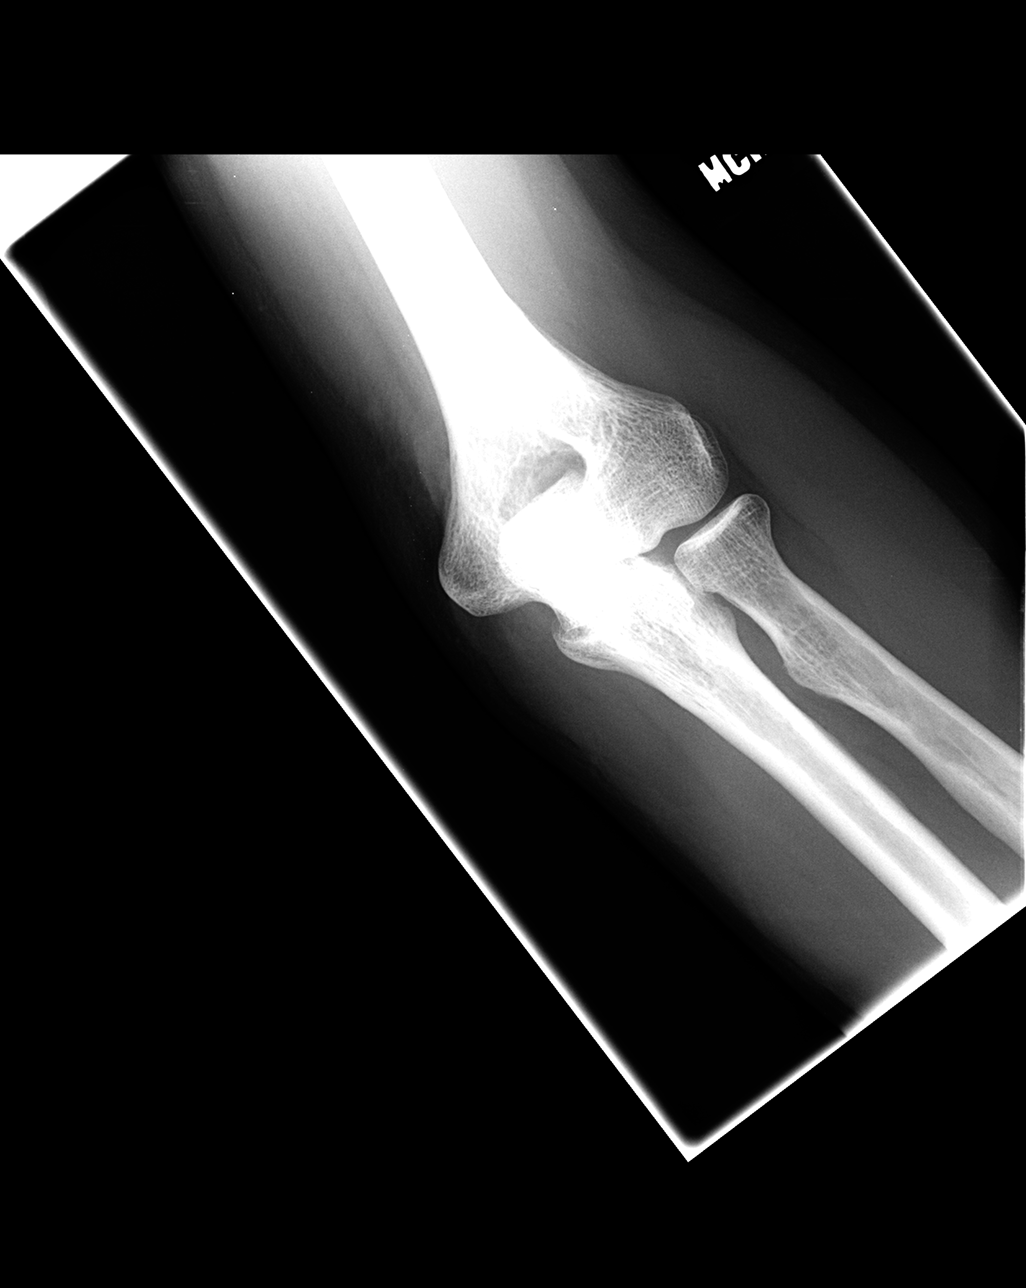

[view not recorded (2 of 4)]
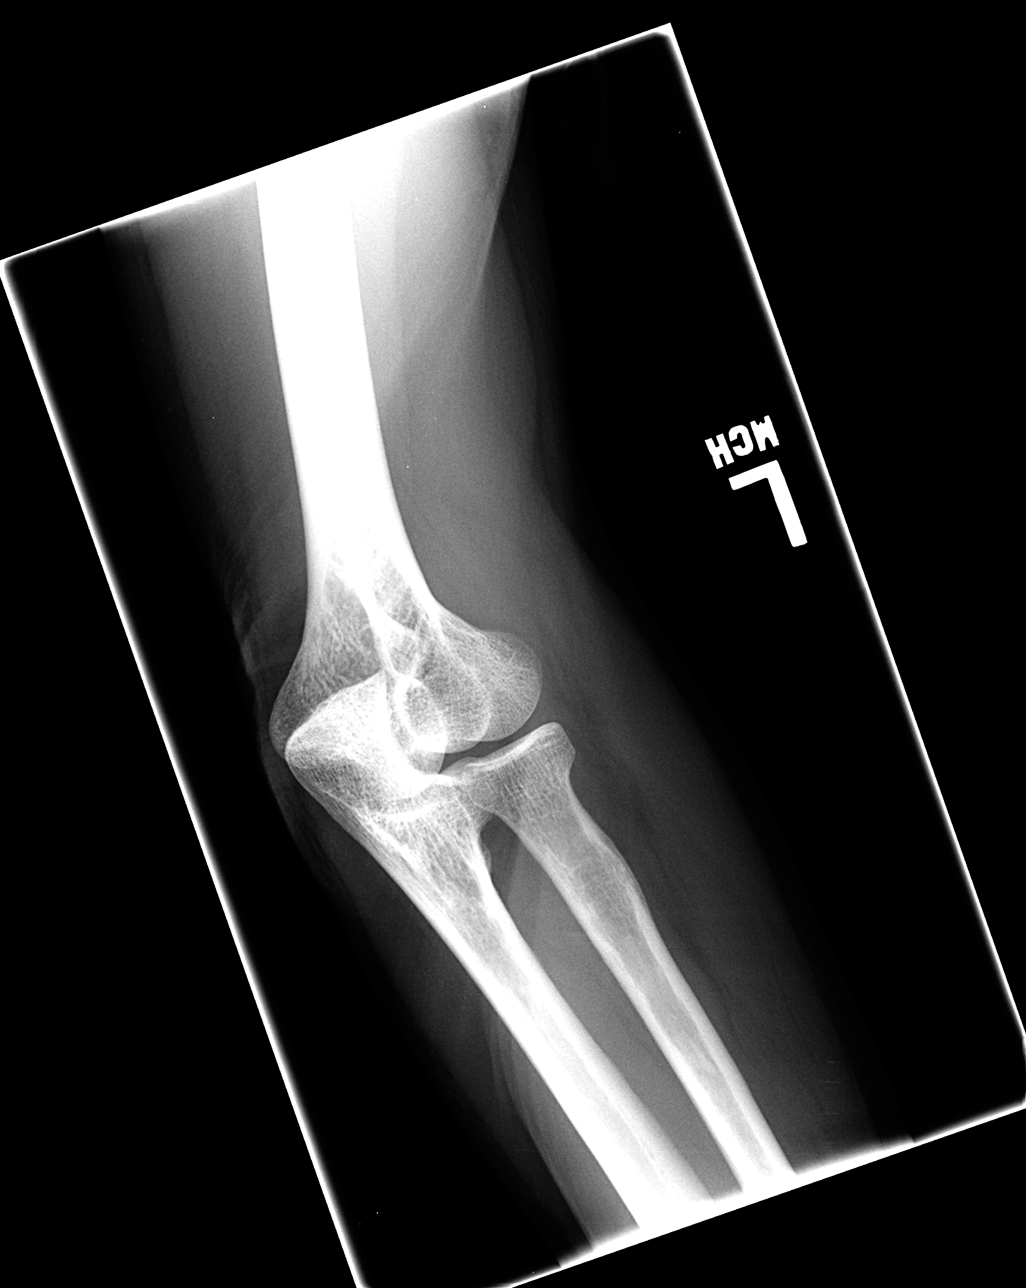

[view not recorded (3 of 4)]
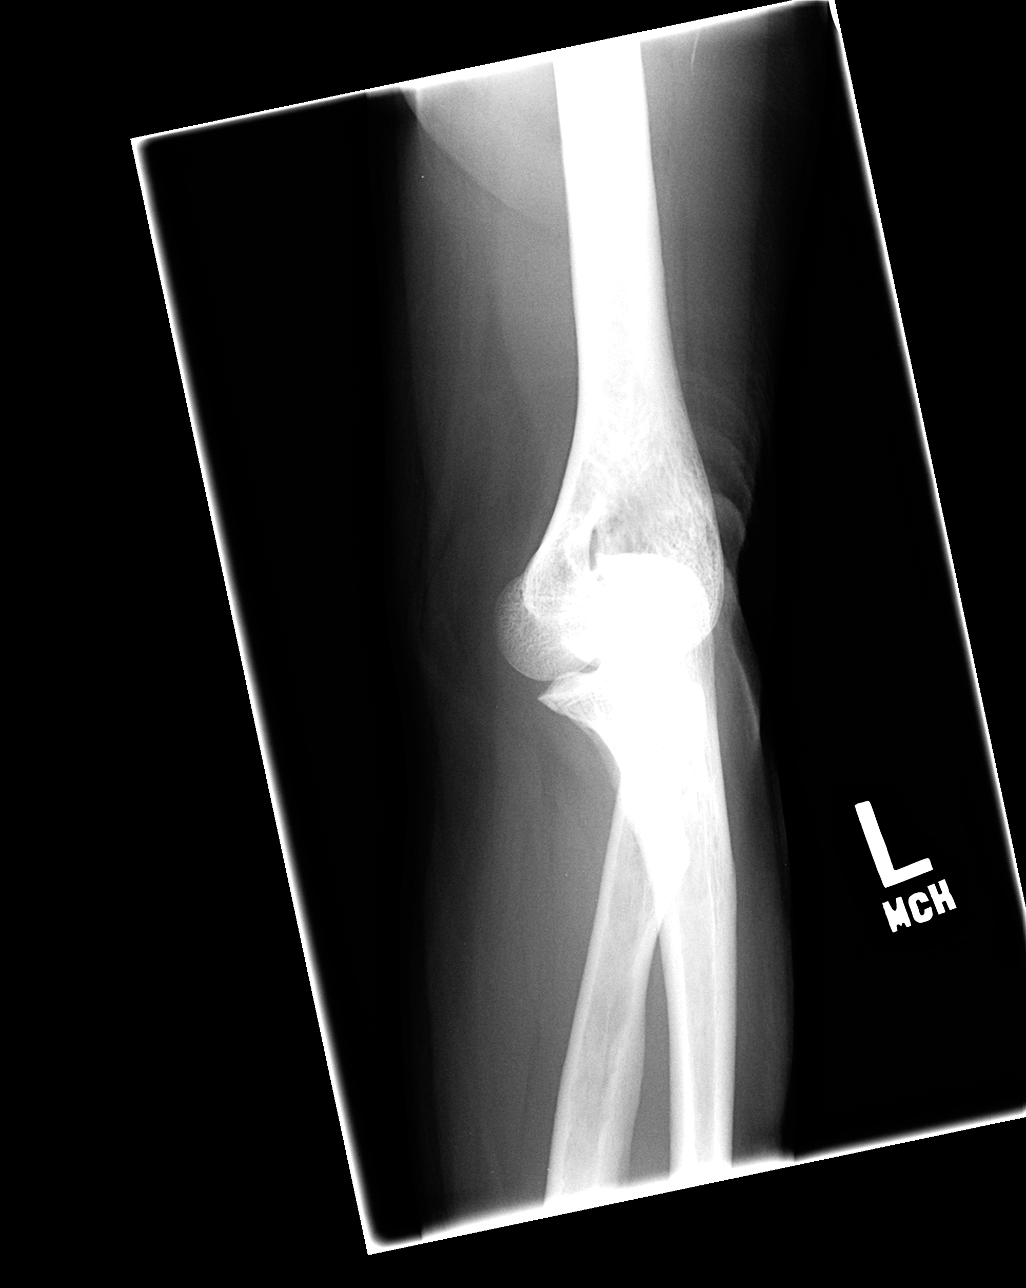

[view not recorded (4 of 4)]
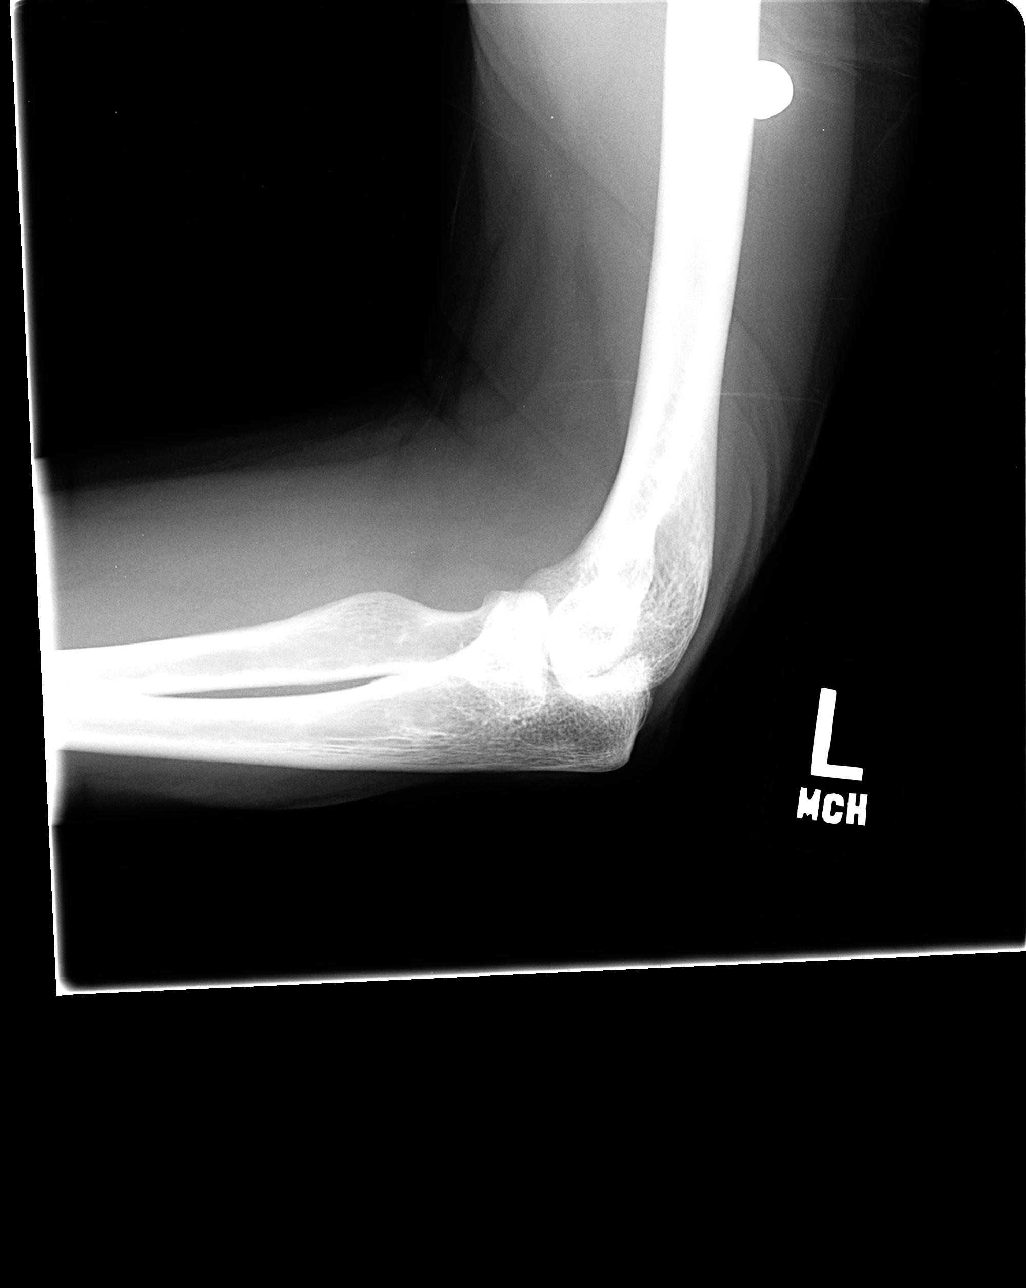

[4 of 4 positions shown; findings below may reference images not displayed]

FINDINGS: No acute fracture. No dislocation.  No elbow joint effusion.
IMPRESSION: No acute bony pathology.

## 2018-04-14 ENCOUNTER — Observation Stay (HOSPITAL_COMMUNITY)
Admission: EM | Admit: 2018-04-14 | Discharge: 2018-04-14 | Discharge status: Against Medical Advice | Payer: Self-pay | Attending: Internal Medicine | Admitting: Internal Medicine

## 2018-04-14 ENCOUNTER — Emergency Department (HOSPITAL_COMMUNITY): Payer: Self-pay

## 2018-04-14 ENCOUNTER — Other Ambulatory Visit: Payer: Self-pay

## 2018-04-14 ENCOUNTER — Encounter (HOSPITAL_COMMUNITY): Payer: Self-pay | Admitting: Emergency Medicine

## 2018-04-14 DIAGNOSIS — F319 Bipolar disorder, unspecified: Secondary | ICD-10-CM | POA: Insufficient documentation

## 2018-04-14 DIAGNOSIS — Z87440 Personal history of urinary (tract) infections: Secondary | ICD-10-CM | POA: Insufficient documentation

## 2018-04-14 DIAGNOSIS — R6510 Systemic inflammatory response syndrome (SIRS) of non-infectious origin without acute organ dysfunction: Principal | ICD-10-CM | POA: Diagnosis present

## 2018-04-14 DIAGNOSIS — Z79899 Other long term (current) drug therapy: Secondary | ICD-10-CM | POA: Insufficient documentation

## 2018-04-14 DIAGNOSIS — M5030 Other cervical disc degeneration, unspecified cervical region: Secondary | ICD-10-CM | POA: Insufficient documentation

## 2018-04-14 DIAGNOSIS — R7989 Other specified abnormal findings of blood chemistry: Secondary | ICD-10-CM

## 2018-04-14 DIAGNOSIS — F419 Anxiety disorder, unspecified: Secondary | ICD-10-CM | POA: Insufficient documentation

## 2018-04-14 DIAGNOSIS — N182 Chronic kidney disease, stage 2 (mild): Secondary | ICD-10-CM | POA: Insufficient documentation

## 2018-04-14 DIAGNOSIS — R109 Unspecified abdominal pain: Secondary | ICD-10-CM | POA: Insufficient documentation

## 2018-04-14 DIAGNOSIS — F17210 Nicotine dependence, cigarettes, uncomplicated: Secondary | ICD-10-CM | POA: Insufficient documentation

## 2018-04-14 DIAGNOSIS — Z880 Allergy status to penicillin: Secondary | ICD-10-CM | POA: Insufficient documentation

## 2018-04-14 DIAGNOSIS — R945 Abnormal results of liver function studies: Secondary | ICD-10-CM

## 2018-04-14 DIAGNOSIS — R569 Unspecified convulsions: Secondary | ICD-10-CM | POA: Insufficient documentation

## 2018-04-14 DIAGNOSIS — K760 Fatty (change of) liver, not elsewhere classified: Secondary | ICD-10-CM | POA: Insufficient documentation

## 2018-04-14 DIAGNOSIS — I129 Hypertensive chronic kidney disease with stage 1 through stage 4 chronic kidney disease, or unspecified chronic kidney disease: Secondary | ICD-10-CM | POA: Insufficient documentation

## 2018-04-14 DIAGNOSIS — Z87442 Personal history of urinary calculi: Secondary | ICD-10-CM | POA: Insufficient documentation

## 2018-04-14 DIAGNOSIS — R112 Nausea with vomiting, unspecified: Secondary | ICD-10-CM | POA: Insufficient documentation

## 2018-04-14 DIAGNOSIS — G8929 Other chronic pain: Secondary | ICD-10-CM | POA: Insufficient documentation

## 2018-04-14 DIAGNOSIS — R51 Headache: Secondary | ICD-10-CM | POA: Insufficient documentation

## 2018-04-14 LAB — I-STAT CG4 LACTIC ACID, ED: Lactic Acid, Venous: 2.49 mmol/L (ref 0.5–1.9)

## 2018-04-14 LAB — URINALYSIS, ROUTINE W REFLEX MICROSCOPIC
BILIRUBIN URINE: NEGATIVE
Glucose, UA: NEGATIVE mg/dL
HGB URINE DIPSTICK: NEGATIVE
Ketones, ur: NEGATIVE mg/dL
Leukocytes, UA: NEGATIVE
Nitrite: NEGATIVE
PH: 6 (ref 5.0–8.0)
Protein, ur: NEGATIVE mg/dL
Specific Gravity, Urine: 1 — ABNORMAL LOW (ref 1.005–1.030)

## 2018-04-14 LAB — CBC WITH DIFFERENTIAL/PLATELET
Abs Immature Granulocytes: 0.1 10*3/uL (ref 0.0–0.1)
Basophils Absolute: 0 10*3/uL (ref 0.0–0.1)
Basophils Relative: 0 %
Eosinophils Absolute: 0 10*3/uL (ref 0.0–0.7)
Eosinophils Relative: 0 %
HEMATOCRIT: 43.6 % (ref 36.0–46.0)
HEMOGLOBIN: 14.3 g/dL (ref 12.0–15.0)
Immature Granulocytes: 0 %
LYMPHS ABS: 0.3 10*3/uL — AB (ref 0.7–4.0)
Lymphocytes Relative: 2 %
MCH: 29.1 pg (ref 26.0–34.0)
MCHC: 32.8 g/dL (ref 30.0–36.0)
MCV: 88.8 fL (ref 78.0–100.0)
MONOS PCT: 1 %
Monocytes Absolute: 0.1 10*3/uL (ref 0.1–1.0)
NEUTROS ABS: 14.6 10*3/uL — AB (ref 1.7–7.7)
Neutrophils Relative %: 97 %
Platelets: 207 10*3/uL (ref 150–400)
RBC: 4.91 MIL/uL (ref 3.87–5.11)
RDW: 12.8 % (ref 11.5–15.5)
WBC: 15.2 10*3/uL — ABNORMAL HIGH (ref 4.0–10.5)

## 2018-04-14 LAB — COMPREHENSIVE METABOLIC PANEL
ALT: 147 U/L — AB (ref 0–44)
AST: 203 U/L — AB (ref 15–41)
Albumin: 3.1 g/dL — ABNORMAL LOW (ref 3.5–5.0)
Alkaline Phosphatase: 190 U/L — ABNORMAL HIGH (ref 38–126)
Anion gap: 9 (ref 5–15)
BILIRUBIN TOTAL: 2 mg/dL — AB (ref 0.3–1.2)
BUN: 14 mg/dL (ref 6–20)
CHLORIDE: 102 mmol/L (ref 98–111)
CO2: 24 mmol/L (ref 22–32)
CREATININE: 1.38 mg/dL — AB (ref 0.44–1.00)
Calcium: 8.9 mg/dL (ref 8.9–10.3)
GFR calc Af Amer: 51 mL/min — ABNORMAL LOW (ref >60)
GFR calc non Af Amer: 44 mL/min — ABNORMAL LOW (ref >60)
Glucose, Bld: 128 mg/dL — ABNORMAL HIGH (ref 70–99)
Potassium: 3.9 mmol/L (ref 3.5–5.1)
Sodium: 135 mmol/L (ref 135–145)
TOTAL PROTEIN: 6.7 g/dL (ref 6.5–8.1)

## 2018-04-14 LAB — LIPASE, BLOOD: Lipase: 24 U/L (ref 11–51)

## 2018-04-14 LAB — POC URINE PREG, ED: PREG TEST UR: NEGATIVE

## 2018-04-14 MED ORDER — SODIUM CHLORIDE 0.9 % IV SOLN
1.0000 g | Freq: Once | INTRAVENOUS | Status: AC
  Administered 2018-04-14: 1 g via INTRAVENOUS
  Filled 2018-04-14: qty 10

## 2018-04-14 MED ORDER — CIPROFLOXACIN HCL 500 MG PO TABS: 500.0000 mg | ORAL_TABLET | Freq: Two times a day (BID) | ORAL | 0 refills | Status: DC

## 2018-04-14 MED ORDER — METRONIDAZOLE 500 MG PO TABS: 500.0000 mg | ORAL_TABLET | Freq: Two times a day (BID) | ORAL | 0 refills | Status: DC

## 2018-04-14 MED ORDER — SODIUM CHLORIDE 0.9 % IV BOLUS
1000.0000 mL | Freq: Once | INTRAVENOUS | Status: AC
  Administered 2018-04-14: 1000 mL via INTRAVENOUS

## 2018-04-14 MED ORDER — METRONIDAZOLE IN NACL 5-0.79 MG/ML-% IV SOLN
500.0000 mg | Freq: Once | INTRAVENOUS | Status: DC
  Filled 2018-04-14: qty 100

## 2018-04-14 MED ORDER — SODIUM CHLORIDE 0.9 % IV SOLN: INTRAVENOUS | Status: DC

## 2018-04-14 MED ORDER — ONDANSETRON HCL 4 MG/2ML IJ SOLN
4.0000 mg | Freq: Once | INTRAMUSCULAR | Status: AC
  Administered 2018-04-14: 4 mg via INTRAVENOUS
  Filled 2018-04-14: qty 2

## 2018-04-14 MED ORDER — IOHEXOL 300 MG/ML  SOLN
80.0000 mL | Freq: Once | INTRAMUSCULAR | Status: AC | PRN
  Administered 2018-04-14: 80 mL via INTRAVENOUS
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
## 2018-04-14 NOTE — ED Notes (Signed)
Pt screaming at this RN to "get this IV the fuck out of me." "I'm hungry and y'all haven't given me any food." Offered patient food and refused yelling "get this fucking IV out of me, I'm getting the fuck out of here." Removed patients IV, now yelling she wants her discharge antibiotics. Paged Dr. Ophelia CharterYates for orders. Waiting for return call.

## 2018-04-14 NOTE — ED Triage Notes (Signed)
C/o L flank pain and vomiting since 10pm.  Denies urinary complaint.

## 2018-04-14 NOTE — ED Notes (Signed)
Pt refusing to be stuck anymore.

## 2018-04-14 NOTE — ED Provider Notes (Signed)
9:06 AM BP 114/61 (BP Location: Right Arm)   Pulse 92   Temp 99.4 F (37.4 C) (Rectal)   Resp 16   LMP 03/31/2018   SpO2 95%  I was asked to intervene for patient safety as [facility] and hospitalist unavailable. The patient is adamant that she is going to leave.  I discussed her risks and the patient appears to have capacity to make the decision.   Date: 04/14/2018 Patient: Tiffany Blankenship Admitted: 04/14/2018  9:10 AM Attending Provider: Jonah BlueYates, Jennifer, MD  Tiffany Blankenship or her authorized caregiver has made the decision for the patient to leave the emergency department against the advice of [name],PA-C.  She or her authorized caregiver has been informed and understands the inherent risks, including death.  She or her authorized caregiver has decided to accept the responsibility for this decision. Tiffany Blankenship and all necessary parties have been advised that she may return for further evaluation or treatment. Her condition at time of discharge was Fair.  Tiffany Blankenship had current vital signs as follows:  Blood pressure 114/61, pulse 92, temperature 99.4 F (37.4 C), temperature source Rectal, resp. rate 16, last menstrual period 03/31/2018, SpO2 95 %.   Tiffany Blankenship or her authorized caregiver has signed the Leaving Against Medical Advice form prior to leaving the department.  Arthor Captainbigail Namira Rosekrans 04/14/2018         Arthor CaptainHarris, Kylin Dubs, PA-C 04/14/18 1702    Benjiman CorePickering, Nathan, MD 04/15/18 2024

## 2018-04-14 NOTE — ED Notes (Signed)
Pt continues to state to EDP that she wants to leave. Also states she wants a new nurse. [facility]

## 2018-04-14 NOTE — ED Notes (Addendum)
Pt d/c home with family driving after ChecotahAbigail PA saw patient and got pt prescriptions. Pt left AMA. Unable to d/c pt due to epic saying "address running infusions first" ALl infusions were stopped in the chart.

## 2018-04-14 NOTE — ED Notes (Signed)
Pt yelling that she's not signing an AMA form. She is getting the fuck out of here. Conversation overheard by Anell BarrKaren Cobb, Dr. Ophelia CharterYates, Arthor CaptainAbigail Harris, PA-C, all who tried to convince patient to stay for treatment.

## 2018-04-14 NOTE — ED Notes (Signed)
Transport here to pick pt up for US, pt states "I'm leaving as soon as I get back, I'm not staying for any more of this bullshit" Asked pt if she would like to leave now since she is planing on leaving once she returns. Pt states "fuck you bitch I want a new nurse when I get back, I told the doctor not you that I was leaving" pt continues to curse at this RN. Field seismologistmilie Charge RN present during entire exchange.

## 2018-04-14 NOTE — Consult Note (Signed)
Consult Note   Tiffany Blankenship:[id number] DOB: 02-Feb-1971 DOA: 04/14/2018  PCP: Darryl Lent, PA-C - "I ain't got one, she ain't my doctor" Consultants:  None Patient coming from:  Home - lives with boyfriend;  NOK: ?  Chief Complaint: back pain  HPI: Tiffany Blankenship is a 47 y.o. female with medical history significant of seizures; chronic pain; and bipolar d/o presenting with back pain.  Symptoms there for 3-4 days.  "Lower lumbar".  She has not done anything different recently.  +fever - subjective.  Denies dysuria.  She was irritable and ready to sign out AMA at the time of my evaluation and so was terse with her responses.   ED Course: Carryover, discussed with Dr. Manus Gunning. 47 yof with hx of bipolar, chronic pain, and CKD II-III presents with bilateral flank pain, fever at home, and N/V. SBP in 80s with HR in 120's on arrival, no fever in ED. Labs with leukocytosis, elevated lactate, hyperbili, and elevated transaminases. CT abd/pelvis negative. UA with very low spec gravity and RN suspects she put water in the specimen cup. BP and HR normalized with 3 liters IVF. No RUQ tenderness, but b/l flank and suprapubic tenderness. Likely UTI with water passed off as urine sample.   Review of Systems: As per HPI; otherwise review of systems reviewed and negative. Patient was generally unwilling to share further information.  Ambulatory Status:  Ambulates without assistance  Past Medical History:  Diagnosis Date  . Anxiety   . Bipolar 1 disorder (HCC)   . Chronic pain   . DDD (degenerative disc disease), cervical   . Depression   . Head injury   . Seizures (HCC)     Past Surgical History:  Procedure Laterality Date  . arm surgery    . TUBAL LIGATION      Social History   Socioeconomic History  . Marital status: Single    Spouse name: Not on file  . Number of children: Not on file  . Years of education: Not on file  . Highest education level: Not on file  Occupational  History  . Not on file  Social Needs  . Financial resource strain: Not on file  . Food insecurity:    Worry: Not on file    Inability: Not on file  . Transportation needs:    Medical: Not on file    Non-medical: Not on file  Tobacco Use  . Smoking status: Current Every Day Smoker    Packs/day: 0.75    Years: 30.00    Pack years: 22.50    Types: Cigarettes  . Smokeless tobacco: Never Used  Substance and Sexual Activity  . Alcohol use: No  . Drug use: No  . Sexual activity: Yes    Birth control/protection: Surgical  Lifestyle  . Physical activity:    Days per week: Not on file    Minutes per session: Not on file  . Stress: Not on file  Relationships  . Social connections:    Talks on phone: Not on file    Gets together: Not on file    Attends religious service: Not on file    Active member of club or organization: Not on file    Attends meetings of clubs or organizations: Not on file    Relationship status: Not on file  . Intimate partner violence:    Fear of current or ex partner: Not on file    Emotionally abused: Not on file  Physically abused: Not on file    Forced sexual activity: Not on file  Other Topics Concern  . Not on file  Social History Narrative  . Not on file    Allergies  Allergen Reactions  . Penicillins Rash    Family History  Problem Relation Age of Onset  . Seizures Other     Prior to Admission medications   Medication Sig Start Date End Date Taking? Authorizing Provider  ALPRAZolam Prudy Feeler(XANAX) 1 MG tablet Take 1 tablet (1 mg total) by mouth at bedtime as needed for anxiety. 07/10/14   Marlon PelGreene, Tiffany, PA-C  ARIPiprazole (ABILIFY) 2 MG tablet Take 2 mg by mouth daily.    [provider]  doxycycline (VIBRAMYCIN) 100 MG capsule Take 1 capsule (100 mg total) by mouth 2 (two) times daily. One po bid x 7 days Patient not taking: Reported on 07/10/2014 01/22/14   Mancel BaleWentz, Elliott, MD  gabapentin (NEURONTIN) 600 MG tablet Take 600 mg by  mouth 3 (three) times daily.    [provider]  HYDROcodone-acetaminophen (NORCO) 10-325 MG per tablet Take 2 tablets by mouth every 6 (six) hours as needed for pain.    [provider]  naproxen sodium (ANAPROX DS) 550 MG tablet Take 1 tablet (550 mg total) by mouth 2 (two) times daily with a meal. Patient not taking: Reported on 07/10/2014 02/07/14   Devoria AlbeKnapp, Iva, MD  oxyCODONE (ROXICODONE) 15 MG immediate release tablet Take 15 mg by mouth every 4 (four) hours as needed for pain (#120 monthly from Dr Tyler DeisWheeler, last filled 6/19).    [provider]  oxyCODONE-acetaminophen (PERCOCET/ROXICET) 5-325 MG per tablet Take 1 tablet by mouth every 6 (six) hours as needed for severe pain. 07/10/14   Marlon PelGreene, Tiffany, PA-C  pseudoephedrine-acetaminophen (TYLENOL SINUS) 30-500 MG TABS Take 1 tablet by mouth every 4 (four) hours as needed (pain).    [provider]    Physical Exam: Vitals:   04/14/18 0240 04/14/18 0434 04/14/18 0608  BP: (!) 89/63 99/65 114/61  Pulse: (!) 122 (!) 102 92  Resp: 18 18 16   Temp: 98.9 F (37.2 C) 99.4 F (37.4 C)   TempSrc:  Rectal   SpO2: 99% 98% 95%     General:  Appears calm and comfortable and is NAD; she appears older than stated age Eyes:   EOMI, normal lids, iris ENT:  grossly normal hearing, lips & tongue, mmm Neck:  no LAD, masses or thyromegaly Cardiovascular:  Mild tachycardia, no m/r/g. No LE edema.  Respiratory:   CTA bilaterally with no wheezes/rales/rhonchi.  Normal respiratory effort. Abdomen:  soft, NT, ND, NABS Skin:  no rash or induration seen on limited exam Musculoskeletal:  grossly normal tone BUE/BLE, good ROM, no bony abnormality Psychiatric: Irritable mood and affect, speech fluent and appropriate, AOx3 Neurologic:  CN 2-12 grossly intact, moves all extremities in coordinated fashion, sensation intact    Radiological Exams on Admission: Ct Abdomen Pelvis W Contrast  Result Date: 04/14/2018 CLINICAL  DATA:  Lower abdominal pain. Nausea and vomiting. Fever. EXAM: CT ABDOMEN AND PELVIS WITH CONTRAST TECHNIQUE: Multidetector CT imaging of the abdomen and pelvis was performed using the standard protocol following bolus administration of intravenous contrast. CONTRAST:  80mL OMNIPAQUE IOHEXOL 300 MG/ML  SOLN COMPARISON:  None. FINDINGS: Lower chest: Atelectasis in the lung bases. Hepatobiliary: Mild fatty infiltration of the liver. No focal liver abnormality is seen. No gallstones, gallbladder wall thickening, or biliary dilatation. Pancreas: Unremarkable. No pancreatic ductal dilatation or surrounding inflammatory  changes. Spleen: Normal in size without focal abnormality. Adrenals/Urinary Tract: Adrenal glands are unremarkable. Kidneys are normal, without renal calculi, focal lesion, or hydronephrosis. Bladder is unremarkable. Stomach/Bowel: Stomach is within normal limits. Appendix appears normal. No evidence of bowel wall thickening, distention, or inflammatory changes. Vascular/Lymphatic: No significant vascular findings are present. No enlarged abdominal or pelvic lymph nodes. Reproductive: Uterus and bilateral adnexa are unremarkable. Other: No abdominal wall hernia or abnormality. No abdominopelvic ascites. Musculoskeletal: No acute or significant osseous findings. IMPRESSION: 1. No acute process demonstrated in the abdomen or pelvis. 2. Mild fatty infiltration of the liver. 3. Mild bibasilar atelectasis. Electronically Signed   By: Burman Nieves M.D.   On: 04/14/2018 06:06   US Abdomen Limited Ruq  Result Date: 04/14/2018 CLINICAL DATA:  Elevated liver function test. Nausea and vomiting, fever. EXAM: ULTRASOUND ABDOMEN LIMITED RIGHT UPPER QUADRANT COMPARISON:  CT abdomen and pelvis April 14, 2018 FINDINGS: Gallbladder: No gallstones or wall thickening visualized. No sonographic Murphy sign noted by sonographer. Common bile duct: Diameter: 4 mm Liver: No focal lesion identified. Within normal limits  in parenchymal echogenicity. Portal vein is patent on color Doppler imaging with normal direction of blood flow towards the liver. IMPRESSION: Normal RIGHT upper quadrant ultrasound. Electronically Signed   By: Awilda Metro M.D.   On: 04/14/2018 06:54    EKG: not done   Labs on Admission: I have personally reviewed the available labs and imaging studies at the time of the admission.  Pertinent labs:   Glucose 128 BUN 14/Creatinine 1.38/GFR 44 AP 190 Albumin 3.1 AST 203/ALT 147/Bili 2.0 Lactate 2.49 WBC 15.2, CBC otherwise WNL Upreg negative   Assessment/Plan Active Problems:   SIRS (systemic inflammatory response syndrome) (HCC)   Patient presenting with sepsis, presumed from UTI (urine appears to have been a water sample).  She was encouraged to stay for admission, but instead signed out against medical advice. This endangers her life, but she was willing to accept that risk despite being encouraged to remain in the hospital at least overnight.  She was provided with outpatient prescriptions by PA Harris for Cipro and Flagyl.   Patient signed out AMA and left the hospital.   Jonah Blue MD [facility]  If note is complete, please contact covering daytime or nighttime physician. [email or web address] Password [email or web address]  04/14/2018, 7:20 AM

## 2018-04-14 NOTE — ED Provider Notes (Signed)
[facility] EMERGENCY DEPARTMENT Provider Note   CSN: [id number] Arrival date & time: 04/14/18  4098     History   Chief Complaint Chief Complaint  Patient presents with  . Flank Pain    HPI Tiffany Blankenship is a 47 y.o. female.  Patient with history of bipolar disorder, chronic pain presenting with a 3-day history of bilateral flank pain, abdominal pain with nausea and vomiting.  Reports she has had pain in both sides of her low back that radiates all the way up her back.  Also with lower abdominal pain is been constant over the past 2 days.  States she is vomited 2 times daily for the past 2 days.  No diarrhea.  Reports fever at home up to 101.  Denies any vaginal bleeding or discharge.  No pain with urination or blood in the urine.  Denies any previous abdominal surgeries other than a tubal ligation.  She also reports headache.  No chest pain or shortness of breath. States she has had kidney infections in the past as well as kidney stones. Denies any IV drug abuse.  The history is provided by the patient.  Flank Pain  Associated symptoms include abdominal pain and headaches. Pertinent negatives include no chest pain and no shortness of breath.    Past Medical History:  Diagnosis Date  . Anxiety   . Bipolar 1 disorder (HCC)   . Chronic pain   . DDD (degenerative disc disease), cervical   . Depression   . Head injury   . Seizures (HCC)     There are no active problems to display for this patient.   Past Surgical History:  Procedure Laterality Date  . arm surgery    . TUBAL LIGATION       OB History    Gravida  4   Para  2   Term  2   Preterm      AB  2   Living  2     SAB  2   TAB      Ectopic      Multiple      Live Births               Home Medications    Prior to Admission medications   Medication Sig Start Date End Date Taking? Authorizing Provider  ALPRAZolam Prudy Feeler) 1 MG tablet Take 1 tablet (1 mg total) by  mouth at bedtime as needed for anxiety. 07/10/14   Marlon Pel, PA-C  ARIPiprazole (ABILIFY) 2 MG tablet Take 2 mg by mouth daily.    [provider]  doxycycline (VIBRAMYCIN) 100 MG capsule Take 1 capsule (100 mg total) by mouth 2 (two) times daily. One po bid x 7 days Patient not taking: Reported on 07/10/2014 01/22/14   Mancel Bale, MD  gabapentin (NEURONTIN) 600 MG tablet Take 600 mg by mouth 3 (three) times daily.    [provider]  HYDROcodone-acetaminophen (NORCO) 10-325 MG per tablet Take 2 tablets by mouth every 6 (six) hours as needed for pain.    [provider]  naproxen sodium (ANAPROX DS) 550 MG tablet Take 1 tablet (550 mg total) by mouth 2 (two) times daily with a meal. Patient not taking: Reported on 07/10/2014 02/07/14   Devoria Albe, MD  oxyCODONE (ROXICODONE) 15 MG immediate release tablet Take 15 mg by mouth every 4 (four) hours as needed for pain (#120 monthly from Dr Tyler Deis, last filled 6/19).  [provider]  oxyCODONE-acetaminophen (PERCOCET/ROXICET) 5-325 MG per tablet Take 1 tablet by mouth every 6 (six) hours as needed for severe pain. 07/10/14   Marlon PelGreene, Tiffany, PA-C  pseudoephedrine-acetaminophen (TYLENOL SINUS) 30-500 MG TABS Take 1 tablet by mouth every 4 (four) hours as needed (pain).    [provider]    Family History Family History  Problem Relation Age of Onset  . Seizures Other     Social History Social History   Tobacco Use  . Smoking status: Current Every Day Smoker    Packs/day: 0.75    Years: 30.00    Pack years: 22.50    Types: Cigarettes  . Smokeless tobacco: Never Used  Substance Use Topics  . Alcohol use: No  . Drug use: No     Allergies   Penicillins   Review of Systems Review of Systems  Constitutional: Positive for activity change, appetite change, fatigue and fever.  Eyes: Negative for visual disturbance.  Respiratory: Negative for cough, chest tightness and shortness of  breath.   Cardiovascular: Negative for chest pain.  Gastrointestinal: Positive for abdominal pain, nausea and vomiting. Negative for diarrhea.  Genitourinary: Positive for flank pain. Negative for dysuria, pelvic pain, vaginal bleeding and vaginal discharge.  Musculoskeletal: Positive for arthralgias, back pain and myalgias.  Neurological: Positive for headaches. Negative for dizziness, weakness and light-headedness.    all other systems are negative except as noted in the HPI and PMH.    Physical Exam Updated Vital Signs BP (!) 89/63   Pulse (!) 122   Temp 98.9 F (37.2 C)   Resp 18   LMP 03/31/2018   SpO2 99%   Physical Exam  Constitutional: She is oriented to person, place, and time. She appears well-developed and well-nourished. No distress.  Tearful anxious  HENT:  Head: Normocephalic and atraumatic.  Mouth/Throat: Oropharynx is clear and moist. No oropharyngeal exudate.  Mildly dry mucus membranes  Eyes: Pupils are equal, round, and reactive to light. Conjunctivae and EOM are normal.  Neck: Normal range of motion. Neck supple.  No meningismus.  Cardiovascular: Normal rate, regular rhythm, normal heart sounds and intact distal pulses.  No murmur heard. Pulmonary/Chest: Effort normal and breath sounds normal. No respiratory distress. She exhibits no tenderness.  Abdominal: Soft. There is tenderness. There is no rebound and no guarding.  Suprapubic tenderness, no guarding or rebound  Musculoskeletal: Normal range of motion. She exhibits tenderness. She exhibits no edema.  Paraspinal lumbar tenderness bilaterally  Neurological: She is alert and oriented to person, place, and time. No cranial nerve deficit. She exhibits normal muscle tone. Coordination normal.   5/5 strength throughout. CN 2-12 intact.Equal grip strength.   Skin: Skin is warm. Capillary refill takes less than 2 seconds.  Psychiatric: She has a normal mood and affect. Her behavior is normal.  Nursing note and  vitals reviewed.    ED Treatments / Results  Labs (all labs ordered are listed, but only abnormal results are displayed) Labs Reviewed  URINALYSIS, ROUTINE W REFLEX MICROSCOPIC - Abnormal; Notable for the following components:      Result Value   Color, Urine COLORLESS (*)    Specific Gravity, Urine 1.000 (*)    All other components within normal limits  CBC WITH DIFFERENTIAL/PLATELET - Abnormal; Notable for the following components:   WBC 15.2 (*)    Neutro Abs 14.6 (*)    Lymphs Abs 0.3 (*)    All other components within normal limits  COMPREHENSIVE METABOLIC PANEL -  Abnormal; Notable for the following components:   Glucose, Bld 128 (*)    Creatinine, Ser 1.38 (*)    Albumin 3.1 (*)    AST 203 (*)    ALT 147 (*)    Alkaline Phosphatase 190 (*)    Total Bilirubin 2.0 (*)    GFR calc non Af Amer 44 (*)    GFR calc Af Amer 51 (*)    All other components within normal limits  I-STAT CG4 LACTIC ACID, ED - Abnormal; Notable for the following components:   Lactic Acid, Venous 2.49 (*)    All other components within normal limits  CULTURE, BLOOD (ROUTINE X 2)  CULTURE, BLOOD (ROUTINE X 2)  URINE CULTURE  LIPASE, BLOOD  POC URINE PREG, ED  I-STAT CG4 LACTIC ACID, ED    EKG None  Radiology Ct Abdomen Pelvis W Contrast  Result Date: 04/14/2018 CLINICAL DATA:  Lower abdominal pain. Nausea and vomiting. Fever. EXAM: CT ABDOMEN AND PELVIS WITH CONTRAST TECHNIQUE: Multidetector CT imaging of the abdomen and pelvis was performed using the standard protocol following bolus administration of intravenous contrast. CONTRAST:  80mL OMNIPAQUE IOHEXOL 300 MG/ML  SOLN COMPARISON:  None. FINDINGS: Lower chest: Atelectasis in the lung bases. Hepatobiliary: Mild fatty infiltration of the liver. No focal liver abnormality is seen. No gallstones, gallbladder wall thickening, or biliary dilatation. Pancreas: Unremarkable. No pancreatic ductal dilatation or surrounding inflammatory changes. Spleen:  Normal in size without focal abnormality. Adrenals/Urinary Tract: Adrenal glands are unremarkable. Kidneys are normal, without renal calculi, focal lesion, or hydronephrosis. Bladder is unremarkable. Stomach/Bowel: Stomach is within normal limits. Appendix appears normal. No evidence of bowel wall thickening, distention, or inflammatory changes. Vascular/Lymphatic: No significant vascular findings are present. No enlarged abdominal or pelvic lymph nodes. Reproductive: Uterus and bilateral adnexa are unremarkable. Other: No abdominal wall hernia or abnormality. No abdominopelvic ascites. Musculoskeletal: No acute or significant osseous findings. IMPRESSION: 1. No acute process demonstrated in the abdomen or pelvis. 2. Mild fatty infiltration of the liver. 3. Mild bibasilar atelectasis. Electronically Signed   By: Burman Nieves M.D.   On: 04/14/2018 06:06    Procedures Procedures (including critical care time)  Medications Ordered in ED Medications  sodium chloride 0.9 % bolus 1,000 mL (has no administration in time range)  sodium chloride 0.9 % bolus 1,000 mL (has no administration in time range)  ondansetron (ZOFRAN) injection 4 mg (has no administration in time range)     Initial Impression / Assessment and Plan / ED Course  I have reviewed the triage vital signs and the nursing notes.  Pertinent labs & imaging results that were available during my care of the patient were reviewed by me and considered in my medical decision making (see chart for details).    Patient with 2-day history of abdominal pain, back pain, fever and vomiting.  She is hypotensive and tachycardic but afebrile.  Reports fever at home to 101.  Septic work-up pursued.  IV fluids given, blood and urine cultures obtained.  Urinalysis negative.  Labs show lactic acidosis, leukocytosis and elevated transaminases.  Lipase is normal. Patient denies any previous diagnosis of hepatitis denies any right upper quadrant  pain. Lipase normal.   CT a/p unrevealing for source of infection. UA negative. Nursing staff suspicious that patient put water in UA cup given low specific gravity and clear appearance. Labs with SIRS. BP remains low.  Unclear source of sepsis. Likely GI in origin. UTI considered despite negative sample. Rocephin and flagyl started due  to PCN allergy.   Recommend admission for continued IV fluids and IV antibiotics. blood cultures are pending.BPhas improved Patient continues to be argumentative, threatening to leave, stating she wants to eat. Informed her that she is allowed to eat. D/w patient that she could deteriorate if she goes home and her illness could be life threatening. Discussed if she left it would be against medical advice.  She is agreeable to receive IV antibiotics. RUQ [place] negative. Admission d/w Dr. Antionette Char. Final Clinical Impressions(s) / ED Diagnoses   Final diagnoses:  Elevated LFTs  SIRS (systemic inflammatory response syndrome) [facility])    ED Discharge Orders    None       Kenzel Ruesch, Jeannett Senior, MD 04/14/18 [phone]

## 2018-04-14 NOTE — Discharge Instructions (Addendum)
Contact a health care provider if: °Your symptoms do not get better after 2 days of treatment. °Your symptoms get worse. °You have a fever. °Get help right away if: °You are unable to take your antibiotics or fluids. °You have shaking chills. °You vomit. °You have severe flank or back pain. °You have extreme weakness or fainting. °

## 2018-04-14 NOTE — ED Notes (Signed)
Dr Manus Gunningancour informed of lactic acid results 2.49

## 2018-04-15 LAB — URINE CULTURE: Culture: NO GROWTH

## 2018-04-19 LAB — CULTURE, BLOOD (ROUTINE X 2)
Culture: NO GROWTH
Culture: NO GROWTH

## 2018-07-18 ENCOUNTER — Other Ambulatory Visit: Payer: Self-pay

## 2018-07-18 ENCOUNTER — Emergency Department (HOSPITAL_COMMUNITY)
Admission: EM | Admit: 2018-07-18 | Discharge: 2018-07-18 | Disposition: A | Discharge status: Home / Self Care | Payer: Self-pay | Attending: Emergency Medicine | Admitting: Emergency Medicine

## 2018-07-18 ENCOUNTER — Encounter (HOSPITAL_COMMUNITY): Payer: Self-pay | Admitting: Emergency Medicine

## 2018-07-18 DIAGNOSIS — Z79899 Other long term (current) drug therapy: Secondary | ICD-10-CM | POA: Insufficient documentation

## 2018-07-18 DIAGNOSIS — N10 Acute tubulo-interstitial nephritis: Secondary | ICD-10-CM | POA: Insufficient documentation

## 2018-07-18 DIAGNOSIS — N12 Tubulo-interstitial nephritis, not specified as acute or chronic: Secondary | ICD-10-CM

## 2018-07-18 DIAGNOSIS — F17210 Nicotine dependence, cigarettes, uncomplicated: Secondary | ICD-10-CM | POA: Insufficient documentation

## 2018-07-18 LAB — URINALYSIS, ROUTINE W REFLEX MICROSCOPIC
Bilirubin Urine: NEGATIVE
Glucose, UA: NEGATIVE mg/dL
Hgb urine dipstick: NEGATIVE
Ketones, ur: NEGATIVE mg/dL
Nitrite: POSITIVE — AB
Protein, ur: NEGATIVE mg/dL
Specific Gravity, Urine: 1.017 (ref 1.005–1.030)
pH: 5 (ref 5.0–8.0)

## 2018-07-18 MED ORDER — OXYCODONE-ACETAMINOPHEN 5-325 MG PO TABS
1.0000 | ORAL_TABLET | Freq: Once | ORAL | Status: AC
  Administered 2018-07-18: 1 via ORAL
  Filled 2018-07-18: qty 1

## 2018-07-18 MED ORDER — ONDANSETRON 4 MG PO TBDP: 4.0000 mg | ORAL_TABLET | Freq: Three times a day (TID) | ORAL | 0 refills | Status: AC | PRN

## 2018-07-18 MED ORDER — ONDANSETRON 4 MG PO TBDP
4.0000 mg | ORAL_TABLET | Freq: Once | ORAL | Status: AC
  Administered 2018-07-18: 4 mg via ORAL
  Filled 2018-07-18: qty 1

## 2018-07-18 MED ORDER — CIPROFLOXACIN HCL 500 MG PO TABS: 500.0000 mg | ORAL_TABLET | Freq: Two times a day (BID) | ORAL | 0 refills | Status: DC

## 2018-07-18 NOTE — ED Provider Notes (Signed)
[facility] Provider Note   CSN: [id number] Arrival date & time: 07/18/18  1943     History   Chief Complaint Chief Complaint  Patient presents with  . Back Pain    HPI Tiffany Blankenship is a 47 y.o. female with PMHx bipolar d/o, anxiety, chronic pain, presenting to the emergency department with complaint of urinary complaints for a few days.  She states she has had malodorous urine with increased frequency.  She states it appears a slightly darker than normal as well.  She denies dysuria.  States she has had an episode of vomiting about 1 week ago.  She has an associated bilateral low back pain that is worse with sitting.  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  Has not been treating her pain at home.  No recent UTI.  No fevers.  The history is provided by the patient.    Past Medical History:  Diagnosis Date  . Anxiety   . Bipolar 1 disorder (HCC)   . Chronic pain   . DDD (degenerative disc disease), cervical   . Depression   . Head injury   . Seizures (HCC)    none recently    Patient Active Problem List   Diagnosis Date Noted  . SIRS (systemic inflammatory response syndrome) (HCC) 04/14/2018    Past Surgical History:  Procedure Laterality Date  . arm surgery    . TUBAL LIGATION       OB History    Gravida  4   Para  2   Term  2   Preterm      AB  2   Living  2     SAB  2   TAB      Ectopic      Multiple      Live Births               Home Medications    Prior to Admission medications   Medication Sig Start Date End Date Taking? Authorizing Provider  ALPRAZolam Prudy Feeler(XANAX) 1 MG tablet Take 1 tablet (1 mg total) by mouth at bedtime as needed for anxiety. 07/10/14   Marlon PelGreene, Tiffany, PA-C  ARIPiprazole (ABILIFY) 2 MG tablet Take 2 mg by mouth daily.    [provider]  ciprofloxacin (CIPRO) 500 MG tablet Take 1 tablet (500 mg total) by mouth 2  (two) times daily. 07/18/18   Robinson, [place]azil[place] N, PA-C  doxycycline (VIBRAMYCIN) 100 MG capsule Take 1 capsule (100 mg total) by mouth 2 (two) times daily. One po bid x 7 days Patient not taking: Reported on 07/10/2014 01/22/14   Mancel BaleWentz, Elliott, MD  gabapentin (NEURONTIN) 600 MG tablet Take 600 mg by mouth 3 (three) times daily.    [provider]  HYDROcodone-acetaminophen (NORCO) 10-325 MG per tablet Take 2 tablets by mouth every 6 (six) hours as needed for pain.    [provider]  metroNIDAZOLE (FLAGYL) 500 MG tablet Take 1 tablet (500 mg total) by mouth 2 (two) times daily. One po bid x 7 days 04/14/18   Arthor CaptainHarris, Abigail, PA-C  naproxen sodium (ANAPROX DS) 550 MG tablet Take 1 tablet (550 mg total) by mouth 2 (two) times daily with a meal. Patient not taking: Reported on 07/10/2014 02/07/14   Devoria AlbeKnapp, Iva, MD  ondansetron (ZOFRAN ODT) 4 MG disintegrating tablet Take 1 tablet (4 mg total) by mouth every 8 (eight) hours as needed  for nausea or vomiting. 07/18/18   Robinson, [place] N, PA-C  oxyCODONE (ROXICODONE) 15 MG immediate release tablet Take 15 mg by mouth every 4 (four) hours as needed for pain (#120 monthly from Dr Tyler Deis, last filled 6/19).    [provider]  oxyCODONE-acetaminophen (PERCOCET/ROXICET) 5-325 MG per tablet Take 1 tablet by mouth every 6 (six) hours as needed for severe pain. 07/10/14   Marlon Pel, PA-C  pseudoephedrine-acetaminophen (TYLENOL SINUS) 30-500 MG TABS Take 1 tablet by mouth every 4 (four) hours as needed (pain).    [provider]    Family History Family History  Problem Relation Age of Onset  . Seizures Other     Social History Social History   Tobacco Use  . Smoking status: Current Every Day Smoker    Packs/day: 0.75    Years: 30.00    Pack years: 22.50    Types: Cigarettes  . Smokeless tobacco: Never Used  Substance Use Topics  . Alcohol use: No  . Drug use: No     Allergies   Penicillins   Review  of Systems Review of Systems  Constitutional: Negative for chills and fever.  Gastrointestinal: Positive for nausea and vomiting.  Genitourinary: Positive for frequency. Negative for dysuria, vaginal bleeding and vaginal discharge.  Musculoskeletal: Positive for back pain.  All other systems reviewed and are negative.    Physical Exam Updated Vital Signs BP 132/83 (BP Location: Right Arm)   Pulse 89   Temp 98.9 F (37.2 C) (Oral)   Resp 18   LMP 07/14/2018   SpO2 100%   Physical Exam Vitals signs and nursing note reviewed.  Constitutional:      General: She is not in acute distress.    Appearance: She is well-developed. She is not ill-appearing.  HENT:     Head: Normocephalic and atraumatic.  Eyes:     Conjunctiva/sclera: Conjunctivae normal.  Cardiovascular:     Rate and Rhythm: Normal rate and regular rhythm.  Pulmonary:     Effort: Pulmonary effort is normal.     Breath sounds: Normal breath sounds.  Abdominal:     General: Bowel sounds are normal. There is no distension.     Palpations: Abdomen is soft.     Tenderness: There is no abdominal tenderness. There is no guarding.  Musculoskeletal:     Comments: Generalized bilateral lumbar paraspinal tenderness.  No midline tenderness.  Skin:    General: Skin is warm.  Neurological:     Mental Status: She is alert.  Psychiatric:        Behavior: Behavior normal.      ED Treatments / Results  Labs (all labs ordered are listed, but only abnormal results are displayed) Labs Reviewed  URINALYSIS, ROUTINE W REFLEX MICROSCOPIC - Abnormal; Notable for the following components:      Result Value   APPearance HAZY (*)    Nitrite POSITIVE (*)    Leukocytes, UA SMALL (*)    Bacteria, UA MANY (*)    All other components within normal limits  URINE CULTURE    EKG None  Radiology No results found.  Procedures Procedures (including critical care time)  Medications Ordered in ED Medications  ondansetron  (ZOFRAN-ODT) disintegrating tablet 4 mg (4 mg Oral Given 07/18/18 2125)  oxyCODONE-acetaminophen (PERCOCET/ROXICET) 5-325 MG per tablet 1 tablet (1 tablet Oral Given 07/18/18 2125)     Initial Impression / Assessment and Plan / ED Course  I have reviewed the triage vital signs and  the nursing notes.  Pertinent labs & imaging results that were available during my care of the patient were reviewed by me and considered in my medical decision making (see chart for details).     Patient with symptoms consistent with UTI, concern for possible pyelonephritis with back pain and nausea and vomiting earlier in the week.  Vital signs are normal, afebrile, normal heart rate and blood pressure.  Abdomen is soft and nontender.  Generalized tenderness to bilateral paraspinal musculature.  Question if this is possibly musculoskeletal in nature given patient's work with heavy lifting.  UA is largely consistent with infection, positive nitrates, small leuks, 21-50 white cells and many bacteria.  Culture sent.  Comfortable with treating this with oral antibiotics given normal vital signs and reassuring exam.  Will send with ciprofloxacin, as patient reports reactions to penicillins and cephalosporins.  Discussed strict return precautions.  Verbalized understanding and is agreeable to plan.  Discussed results, findings, treatment and follow up. Patient advised of return precautions. Patient verbalized understanding and agreed with plan.  Final Clinical Impressions(s) / ED Diagnoses   Final diagnoses:  Pyelonephritis    ED Discharge Orders         Ordered    ciprofloxacin (CIPRO) 500 MG tablet  2 times daily     07/18/18 2128    ondansetron (ZOFRAN ODT) 4 MG disintegrating tablet  Every 8 hours PRN     07/18/18 2128           Robinson, [place] N, PA-C 07/18/18 2323    Terrilee FilesButler, Michael C, MD 07/19/18 [phone]

## 2018-07-18 NOTE — Discharge Instructions (Addendum)
Please read instructions below. You can take ibuprofen and alternate with tylenol every 4 hours as needed for pain. Take your antibiotic, ciprofloxacin, as directed until it is gone. Drink plenty of water. You can take zofran every 8 hours as needed for pain. Schedule an appointment with your primary care provider to follow up in 1 week. Return to the ER if you develop a fever, have uncontrollable vomiting, or new or worsening symptoms.

## 2018-07-18 NOTE — ED Triage Notes (Signed)
C/o "stabbing" lower back pain x 1 week with dark, foul smelling urine.

## 2018-07-18 NOTE — ED Notes (Signed)
Urine culture sent down to lab with  Pt's urine sample

## 2018-07-21 LAB — URINE CULTURE: Culture: >=100000 — AB

## 2018-07-22 ENCOUNTER — Telehealth: Payer: Self-pay

## 2018-07-22 NOTE — Telephone Encounter (Signed)
Post ED Visit - Positive Culture Follow-up  Culture report reviewed by antimicrobial stewardship pharmacist:  []  Enzo BiNathan Batchelder, Pharm.D. []  Celedonio MiyamotoJeremy Frens, Pharm.D., BCPS AQ-ID []  Garvin FilaMike Maccia, Pharm.D., BCPS []  Georgina PillionElizabeth Martin, [address]Ph[address]D., BCPS []  GrangerMinh Pham, [address]Pharm[address]D., BCPS, AAHIVP []  Estella HuskMichelle Turner, Pharm.D., BCPS, AAHIVP []  Lysle Pearlachel Rumbarger, PharmD, BCPS []  Phillips Climeshuy Dang, PharmD, BCPS []  Agapito GamesAlison Masters, PharmD, BCPS []  Verlan FriendsErin Deja, PharmD C [facility] Positive urine culture Treated with Ciprofloxacin, organism sensitive to the same and no further patient follow-up is required at this time.  Jerry CarasCullom, Cayde Held Burnett 07/22/2018, 11:40 AM

## 2018-08-20 ENCOUNTER — Emergency Department (HOSPITAL_COMMUNITY)
Admission: EM | Admit: 2018-08-20 | Discharge: 2018-08-20 | Disposition: A | Discharge status: Home / Self Care | Payer: Self-pay | Attending: Emergency Medicine | Admitting: Emergency Medicine

## 2018-08-20 ENCOUNTER — Emergency Department (HOSPITAL_COMMUNITY): Payer: Self-pay

## 2018-08-20 ENCOUNTER — Encounter (HOSPITAL_COMMUNITY): Payer: Self-pay | Admitting: Emergency Medicine

## 2018-08-20 ENCOUNTER — Other Ambulatory Visit: Payer: Self-pay

## 2018-08-20 DIAGNOSIS — Z79899 Other long term (current) drug therapy: Secondary | ICD-10-CM | POA: Insufficient documentation

## 2018-08-20 DIAGNOSIS — J189 Pneumonia, unspecified organism: Secondary | ICD-10-CM

## 2018-08-20 DIAGNOSIS — J181 Lobar pneumonia, unspecified organism: Secondary | ICD-10-CM | POA: Insufficient documentation

## 2018-08-20 DIAGNOSIS — F17210 Nicotine dependence, cigarettes, uncomplicated: Secondary | ICD-10-CM | POA: Insufficient documentation

## 2018-08-20 LAB — COMPREHENSIVE METABOLIC PANEL
ALK PHOS: 250 U/L — AB (ref 38–126)
ALT: 22 U/L (ref 0–44)
AST: 29 U/L (ref 15–41)
Albumin: 2.9 g/dL — ABNORMAL LOW (ref 3.5–5.0)
Anion gap: 11 (ref 5–15)
BUN: 13 mg/dL (ref 6–20)
CALCIUM: 8.4 mg/dL — AB (ref 8.9–10.3)
CO2: 27 mmol/L (ref 22–32)
CREATININE: 1.15 mg/dL — AB (ref 0.44–1.00)
Chloride: 92 mmol/L — ABNORMAL LOW (ref 98–111)
GFR calc Af Amer: >60 mL/min (ref >60)
GFR calc non Af Amer: 56 mL/min — ABNORMAL LOW (ref >60)
Glucose, Bld: 117 mg/dL — ABNORMAL HIGH (ref 70–99)
Potassium: 3.4 mmol/L — ABNORMAL LOW (ref 3.5–5.1)
Sodium: 130 mmol/L — ABNORMAL LOW (ref 135–145)
Total Bilirubin: 1.2 mg/dL (ref 0.3–1.2)
Total Protein: 7.5 g/dL (ref 6.5–8.1)

## 2018-08-20 LAB — CBC
HCT: 42.9 % (ref 36.0–46.0)
Hemoglobin: 14 g/dL (ref 12.0–15.0)
MCH: 29.3 pg (ref 26.0–34.0)
MCHC: 32.6 g/dL (ref 30.0–36.0)
MCV: 89.7 fL (ref 80.0–100.0)
Platelets: 171 10*3/uL (ref 150–400)
RBC: 4.78 MIL/uL (ref 3.87–5.11)
RDW: 12.7 % (ref 11.5–15.5)
WBC: 9.3 10*3/uL (ref 4.0–10.5)
nRBC: 0 % (ref 0.0–0.2)

## 2018-08-20 LAB — LIPASE, BLOOD: Lipase: 22 U/L (ref 11–51)

## 2018-08-20 LAB — I-STAT BETA HCG BLOOD, ED (MC, WL, AP ONLY): I-stat hCG, quantitative: <5 m[IU]/mL (ref <5)

## 2018-08-20 MED ORDER — DOXYCYCLINE HYCLATE 100 MG PO CAPS: 100.0000 mg | ORAL_CAPSULE | Freq: Two times a day (BID) | ORAL | 0 refills | Status: AC

## 2018-08-20 MED ORDER — POTASSIUM CHLORIDE CRYS ER 20 MEQ PO TBCR
40.0000 meq | EXTENDED_RELEASE_TABLET | Freq: Once | ORAL | Status: AC
  Administered 2018-08-20: 40 meq via ORAL
  Filled 2018-08-20: qty 2

## 2018-08-20 MED ORDER — ACETAMINOPHEN 500 MG PO TABS
1000.0000 mg | ORAL_TABLET | Freq: Once | ORAL | Status: AC
  Administered 2018-08-20: 1000 mg via ORAL
  Filled 2018-08-20: qty 2

## 2018-08-20 MED ORDER — DOXYCYCLINE HYCLATE 100 MG PO TABS
100.0000 mg | ORAL_TABLET | Freq: Once | ORAL | Status: AC
  Administered 2018-08-20: 100 mg via ORAL
  Filled 2018-08-20: qty 1

## 2018-08-20 NOTE — ED Provider Notes (Signed)
[facility] EMERGENCY DEPARTMENT Provider Note   CSN: [id number] Arrival date & time: 08/20/18  0000     History   Chief Complaint Chief Complaint  Patient presents with  . Nausea    body aches  . Emesis  . Shortness of Breath    HPI Tiffany Blankenship is a 48 y.o. female.  The history is provided by the patient.  Emesis  Shortness of Breath  Associated symptoms: vomiting   She has history of bipolar disorder, seizures and comes in with fever and cough for the last 5 days.  Fever has been as high as 103 with associated chills and sweats.  Cough is productive of brown sputum.  She complains of generalized myalgias and arthralgias.  She denies sore throat.  There has been some nausea with a couple of episodes of emesis.  There is been no diarrhea.  She does admit to sick contacts.  She has not had the influenza immunization.  Past Medical History:  Diagnosis Date  . Anxiety   . Bipolar 1 disorder (HCC)   . Chronic pain   . DDD (degenerative disc disease), cervical   . Depression   . Head injury   . Seizures (HCC)    none recently    Patient Active Problem List   Diagnosis Date Noted  . SIRS (systemic inflammatory response syndrome) (HCC) 04/14/2018    Past Surgical History:  Procedure Laterality Date  . arm surgery    . TUBAL LIGATION       OB History    Gravida  4   Para  2   Term  2   Preterm      AB  2   Living  2     SAB  2   TAB      Ectopic      Multiple      Live Births               Home Medications    Prior to Admission medications   Medication Sig Start Date End Date Taking? Authorizing Provider  ALPRAZolam Prudy Feeler) 1 MG tablet Take 1 tablet (1 mg total) by mouth at bedtime as needed for anxiety. 07/10/14   Marlon Pel, PA-C  ARIPiprazole (ABILIFY) 2 MG tablet Take 2 mg by mouth daily.    [provider]  doxycycline (VIBRAMYCIN) 100 MG capsule Take 1 capsule (100 mg total) by mouth 2 (two)  times daily. 08/20/18   Dione Booze, MD  gabapentin (NEURONTIN) 600 MG tablet Take 600 mg by mouth 3 (three) times daily.    [provider]  HYDROcodone-acetaminophen (NORCO) 10-325 MG per tablet Take 2 tablets by mouth every 6 (six) hours as needed for pain.    [provider]  naproxen sodium (ANAPROX DS) 550 MG tablet Take 1 tablet (550 mg total) by mouth 2 (two) times daily with a meal. Patient not taking: Reported on 07/10/2014 02/07/14   Devoria Albe, MD  ondansetron (ZOFRAN ODT) 4 MG disintegrating tablet Take 1 tablet (4 mg total) by mouth every 8 (eight) hours as needed for nausea or vomiting. 07/18/18   Robinson, [place] N, PA-C  oxyCODONE (ROXICODONE) 15 MG immediate release tablet Take 15 mg by mouth every 4 (four) hours as needed for pain (#120 monthly from Dr Tyler Deis, last filled 6/19).    [provider]  oxyCODONE-acetaminophen (PERCOCET/ROXICET) 5-325 MG per tablet Take 1 tablet by mouth every 6 (six) hours as needed for severe  pain. 07/10/14   Marlon Pel, PA-C  pseudoephedrine-acetaminophen (TYLENOL SINUS) 30-500 MG TABS Take 1 tablet by mouth every 4 (four) hours as needed (pain).    [provider]    Family History Family History  Problem Relation Age of Onset  . Seizures Other     Social History Social History   Tobacco Use  . Smoking status: Current Every Day Smoker    Packs/day: 0.75    Years: 30.00    Pack years: 22.50    Types: Cigarettes  . Smokeless tobacco: Never Used  Substance Use Topics  . Alcohol use: No  . Drug use: No     Allergies   Penicillins   Review of Systems Review of Systems  Respiratory: Positive for shortness of breath.   Gastrointestinal: Positive for vomiting.  All other systems reviewed and are negative.    Physical Exam Updated Vital Signs BP 128/77 (BP Location: Right Arm)   Pulse (!) 117   Temp (!) 102.1 F (38.9 C) (Oral)   Resp 17   Ht 5\' 3"  (1.6 m)   Wt 56.7 kg   SpO2 94%    BMI 22.14 kg/m   Physical Exam Vitals signs and nursing note reviewed.    48 year old female, resting comfortably and in no acute distress. Vital signs are significant for elevated temperature and heart rate. Oxygen saturation is 94%, which is normal. Head is normocephalic and atraumatic. PERRLA, EOMI. Oropharynx is clear. Neck is nontender and supple without adenopathy or JVD. Back is nontender and there is no CVA tenderness. Lungs have rales at the right base without wheezes or rhonchi. Chest is nontender. Heart has regular rate and rhythm without murmur. Abdomen is soft, flat, nontender without masses or hepatosplenomegaly and peristalsis is normoactive. Extremities have no cyanosis or edema, full range of motion is present. Skin is warm and dry without rash. Neurologic: Mental status is normal, cranial nerves are intact, there are no motor or sensory deficits.  ED Treatments / Results  Labs (all labs ordered are listed, but only abnormal results are displayed) Labs Reviewed  COMPREHENSIVE METABOLIC PANEL - Abnormal; Notable for the following components:      Result Value   Sodium 130 (*)    Potassium 3.4 (*)    Chloride 92 (*)    Glucose, Bld 117 (*)    Creatinine, Ser 1.15 (*)    Calcium 8.4 (*)    Albumin 2.9 (*)    Alkaline Phosphatase 250 (*)    GFR calc non Af Amer 56 (*)    All other components within normal limits  LIPASE, BLOOD  CBC  URINALYSIS, ROUTINE W REFLEX MICROSCOPIC  I-STAT BETA HCG BLOOD, ED ([facility], [facility], [facility] ONLY)   Radiology Dg Chest 2 View  Result Date: 08/20/2018 CLINICAL DATA:  48 year old female with fever and shortness of breath. EXAM: CHEST - 2 VIEW COMPARISON:  Chest radiograph dated 05/15/2013 FINDINGS: There is diffuse interstitial coarsening with apparent small confluent areas of densities concerning for developing infiltrate. Clinical correlation is recommended. There is no focal consolidation or pneumothorax. Slight blunting of the left  costophrenic angle may represent scarring or small pleural effusion. The cardiac silhouette is within normal limits. No acute osseous pathology. IMPRESSION: Findings concerning for developing infiltrate. Clinical correlation is recommended. Electronically Signed   By: Elgie Collard M.D.   On: 08/20/2018 01:01    Procedures Procedures  Medications Ordered in ED Medications  doxycycline (VIBRA-TABS) tablet 100 mg (has no administration in  time range)  acetaminophen (TYLENOL) tablet 1,000 mg (1,000 mg Oral Given 08/20/18 0246)     Initial Impression / Assessment and Plan / ED Course  I have reviewed the triage vital signs and the nursing notes.  Pertinent labs & imaging results that were available during my care of the patient were reviewed by me and considered in my medical decision making (see chart for details).  Influenza-like illness.  However, with localized rales, I am concerned about possibility of pneumonia.  Chest x-ray does not show definite infiltrate but is certainly suggestive of pneumonia.  Labs do show mild hyponatremia and hypokalemia.  She is given a dose of oral potassium.  She is not ill-appearing and should be able to be treated adequately as an outpatient.  She is given a dose of doxycycline and discharged with prescription for same.  Return precautions discussed.  Old records are reviewed, and she has no relevant past visits.  Final Clinical Impressions(s) / ED Diagnoses   Final diagnoses:  Community acquired pneumonia of right lower lobe of lung [facility](HCC)    ED Discharge Orders         Ordered    doxycycline (VIBRAMYCIN) 100 MG capsule  2 times daily     08/20/18 0506           Dione BoozeGlick, Darrell Leonhardt, MD 08/20/18 [phone]

## 2018-08-20 NOTE — ED Notes (Signed)
Patient Alert and oriented to baseline. Stable and ambulatory to baseline. Patient verbalized understanding of the discharge instructions.  Patient belongings were taken by the patient.   

## 2018-08-20 NOTE — ED Notes (Signed)
Patient ambulatory to the room, no distress noted

## 2018-08-20 NOTE — ED Triage Notes (Signed)
Per pt she has been having body aches and n/v. Some wheezing and hard to breath pt stated. Pt has a low grade fever of 100.1 at this time

## 2019-01-28 DEATH — deceased
